# Patient Record
Sex: Female | Born: 1975 | Race: White | Hispanic: No | Marital: Married | Smoking: Never smoker
Health system: Southern US, Community
[De-identification: ages and names within clinical notes are randomized; demographics above are authoritative.]

## PROBLEM LIST (undated history)

## (undated) DIAGNOSIS — T7840XA Allergy, unspecified, initial encounter: Secondary | ICD-10-CM

## (undated) DIAGNOSIS — R32 Unspecified urinary incontinence: Secondary | ICD-10-CM

## (undated) DIAGNOSIS — R519 Headache, unspecified: Secondary | ICD-10-CM

## (undated) DIAGNOSIS — E079 Disorder of thyroid, unspecified: Secondary | ICD-10-CM

## (undated) DIAGNOSIS — R51 Headache: Secondary | ICD-10-CM

## (undated) HISTORY — DX: Unspecified urinary incontinence: R32

## (undated) HISTORY — DX: Disorder of thyroid, unspecified: E07.9

## (undated) HISTORY — DX: Allergy, unspecified, initial encounter: T78.40XA

## (undated) HISTORY — DX: Headache: R51

## (undated) HISTORY — DX: Headache, unspecified: R51.9

---

## 2010-04-20 ENCOUNTER — Ambulatory Visit: Payer: Self-pay | Admitting: Family Medicine

## 2010-04-20 DIAGNOSIS — J020 Streptococcal pharyngitis: Secondary | ICD-10-CM | POA: Insufficient documentation

## 2010-04-20 DIAGNOSIS — E039 Hypothyroidism, unspecified: Secondary | ICD-10-CM | POA: Insufficient documentation

## 2010-04-21 ENCOUNTER — Telehealth (INDEPENDENT_AMBULATORY_CARE_PROVIDER_SITE_OTHER): Payer: Self-pay | Admitting: *Deleted

## 2010-07-28 NOTE — Letter (Signed)
Summary: Out of Work  [facility]  [address]   [place], [place] [address]   Phone: [phone]  Fax: [phone]    April 20, 2010   Employee:  [employer]    To Whom It May Concern:   For Medical reasons, please excuse the above named employee from work today and tomorrow.   If you need additional information, please feel free to contact our office.         Sincerely,    Donna Christen MD

## 2010-07-28 NOTE — Progress Notes (Signed)
  Phone Note Outgoing Call Call back at [facility] Phone [phone]   Call placed by: Lajean Saver RN,  April 21, 2010 1:19 PM Call placed to: Patient Action Taken: Phone Call Completed Summary of Call: Callback: Patient states that her fever broke earlier today and the soreness in her throat is improved. She is taking her medications as prescribed.

## 2010-07-28 NOTE — Letter (Signed)
Summary: Handout Printed  Printed Handout:  - Rheumatic Fever 

## 2010-07-28 NOTE — Assessment & Plan Note (Signed)
Summary: HIGH FEVER/SEVERE SORE THROAT/NAUESA Room 5   Vital Signs:  Patient Profile:   35 Years Old Female CC:      Sore throat, fever, body aches, headache, diahrrea, nausea x 3 days Height:     65 inches Weight:      200 pounds O2 Sat:      99 % O2 treatment:    Room Air Temp:     102.3 degrees F oral Pulse rate:   124 / minute Pulse rhythm:   irregular Resp:     20 per minute BP sitting:   134 / 86  (left arm) Cuff size:   regular  Vitals Entered By: Emilio Math (April 20, 2010 8:38 AM)                  Current Allergies: No known allergies History of Present Illness Chief Complaint: Sore throat, fever, body aches, headache, diahrrea, nausea x 3 days History of Present Illness:  Subjective: Patient complains of sore throat for 3 days, worse on the right. No cough No pleuritic pain No wheezing No nasal congestion No post-nasal drainage No sinus pain/pressure No itchy/red eyes No earache No hemoptysis No SOB + fever/chills + nausea No vomiting No abdominal pain + diarrhea (several loose stools recently) No skin rashes + fatigue + myalgias + headache Used OTC meds without relief   REVIEW OF SYSTEMS Constitutional Symptoms       Complains of fever, chills, and night sweats.     Denies weight loss, weight gain, and fatigue.  Eyes       Denies change in vision, eye pain, eye discharge, glasses, contact lenses, and eye surgery. Ear/Nose/Throat/Mouth       Complains of ear pain and sore throat.      Denies hearing loss/aids, change in hearing, ear discharge, dizziness, frequent runny nose, frequent nose bleeds, sinus problems, hoarseness, and tooth pain or bleeding.  Respiratory       Denies dry cough, productive cough, wheezing, shortness of breath, asthma, bronchitis, and emphysema/COPD.  Cardiovascular       Denies murmurs, chest pain, and tires easily with exhertion.    Gastrointestinal       Complains of nausea/vomiting and diarrhea.      Denies  stomach pain, constipation, blood in bowel movements, and indigestion. Genitourniary       Denies painful urination, kidney stones, and loss of urinary control. Neurological       Complains of headaches.      Denies paralysis, seizures, and fainting/blackouts. Musculoskeletal       Denies muscle pain, joint pain, joint stiffness, decreased range of motion, redness, swelling, muscle weakness, and gout.  Skin       Denies bruising, unusual mles/lumps or sores, and hair/skin or nail changes.  Psych       Denies mood changes, temper/anger issues, anxiety/stress, speech problems, depression, and sleep problems.  Past History:  Past Medical History: Hypothyroidism  Past Surgical History: Denies surgical history  Family History: Mother, D  MVA Father, Healthy  Social History: non smoker ETOH yes  No Drugs Mrg [employer]   Objective:  No acute distress but appears uncomfortable Eyes:  Pupils are equal, round, and reactive to light and accomdation.  Extraocular movement is intact.  Conjunctivae are not inflamed.  Ears:  Canals normal.  Tympanic membranes normal.   Nose:  Normal septum.  Normal turbinates, mildly congested.    No sinus tenderness present.  Mouth:  moist mucous membranes  Neck:  Supple.  Tender enlarged anterior nodes are palpated bilaterally, worse on the right Lungs:  Clear to auscultation.  Breath sounds are equal.  Heart:  Regular rate and rhythm without murmurs, rubs, or gallops.  Abdomen:  Nontender without masses or hepatosplenomegaly.  Bowel sounds are present.  No CVA or flank tenderness.  Skin:  No rash Rapid strep test positive Assessment New Problems: PHARYNGITIS, STREPTOCOCCAL (ICD-034.0) HYPOTHYROIDISM (ICD-244.9)   Plan New Medications/Changes: LIDOCAINE VISCOUS 2 % SOLN (LIDOCAINE HCL) 15ml by mouth q3 to 4hr as needed.  Swish and spit out.  Max 8 doses/day  #200cc x 0, 04/20/2010, Donna Christen MD PREDNISONE 10 MG TABS (PREDNISONE) 2 PO BID for 2  days, then 1 BID for 2 days, then 1 daily for 2 days.  Take PC  #14 x 0, 04/20/2010, Donna Christen MD PENICILLIN V POTASSIUM 500 MG TABS (PENICILLIN V POTASSIUM) 1 by mouth q8hr for 10 days  #30 x 0, 04/20/2010, Donna Christen MD  New Orders: Rapid Strep [phone] New Patient Level III [99203] Planning Comments:   Begin oral penicillin for 10 days.  Tapering course of prednisone.  Lidocaine gargles.  Increase fluid intake. Follow-up with PCP if not improving 3 to 4 days. Return (or go to ER) for worsening symptoms (inability to swallow, etc)   The patient and/or caregiver has been counseled thoroughly with regard to medications prescribed including dosage, schedule, interactions, rationale for use, and possible side effects and they verbalize understanding.  Diagnoses and expected course of recovery discussed and will return if not improved as expected or if the condition worsens. Patient and/or caregiver verbalized understanding.  Prescriptions: LIDOCAINE VISCOUS 2 % SOLN (LIDOCAINE HCL) 15ml by mouth q3 to 4hr as needed.  Swish and spit out.  Max 8 doses/day  #200cc x 0   Entered and Authorized by:   Donna Christen MD   Signed by:   Donna Christen MD on 04/20/2010   Method used:   Print then Give to Patient   RxID:   [id number] PREDNISONE 10 MG TABS (PREDNISONE) 2 PO BID for 2 days, then 1 BID for 2 days, then 1 daily for 2 days.  Take PC  #14 x 0   Entered and Authorized by:   Donna Christen MD   Signed by:   Donna Christen MD on 04/20/2010   Method used:   Print then Give to Patient   RxID:   [id number] PENICILLIN V POTASSIUM 500 MG TABS (PENICILLIN V POTASSIUM) 1 by mouth q8hr for 10 days  #30 x 0   Entered and Authorized by:   Donna Christen MD   Signed by:   Donna Christen MD on 04/20/2010   Method used:   Print then Give to Patient   RxID:   [id number]   Orders Added: 1)  Rapid Strep [73220] 2)  New Patient Level III [25427]  Appended Document: HIGH FEVER/SEVERE  SORE THROAT/NAUESA Room 5 Rapid Strep: Pos

## 2015-07-30 ENCOUNTER — Telehealth: Payer: Self-pay | Admitting: Behavioral Health

## 2015-07-30 NOTE — Telephone Encounter (Signed)
Unable to reach patient at time of Pre-Visit Call.  Left message for patient to return call when available.    

## 2015-07-31 ENCOUNTER — Encounter: Payer: Self-pay | Admitting: Medical

## 2015-07-31 ENCOUNTER — Ambulatory Visit (INDEPENDENT_AMBULATORY_CARE_PROVIDER_SITE_OTHER): Payer: BLUE CROSS/BLUE SHIELD | Admitting: Medical

## 2015-07-31 VITALS — BP 120/80 | HR 81 | Temp 98.3°F | Ht 65.5 in | Wt 220.0 lb

## 2015-07-31 DIAGNOSIS — E039 Hypothyroidism, unspecified: Secondary | ICD-10-CM | POA: Diagnosis not present

## 2015-07-31 DIAGNOSIS — Z0001 Encounter for general adult medical examination with abnormal findings: Secondary | ICD-10-CM

## 2015-07-31 DIAGNOSIS — J309 Allergic rhinitis, unspecified: Secondary | ICD-10-CM

## 2015-07-31 DIAGNOSIS — Z0000 Encounter for general adult medical examination without abnormal findings: Secondary | ICD-10-CM

## 2015-07-31 DIAGNOSIS — N910 Primary amenorrhea: Secondary | ICD-10-CM

## 2015-07-31 DIAGNOSIS — N926 Irregular menstruation, unspecified: Secondary | ICD-10-CM

## 2015-07-31 DIAGNOSIS — M722 Plantar fascial fibromatosis: Secondary | ICD-10-CM

## 2015-07-31 LAB — POC URINALSYSI DIPSTICK (AUTOMATED)
BILIRUBIN UA: NEGATIVE
GLUCOSE UA: NEGATIVE
KETONES UA: NEGATIVE
Leukocytes, UA: NEGATIVE
Nitrite, UA: NEGATIVE
Protein, UA: NEGATIVE
RBC UA: NEGATIVE
UROBILINOGEN UA: 0.2
pH, UA: 5.5

## 2015-07-31 LAB — POCT URINE PREGNANCY: Preg Test, Ur: NEGATIVE

## 2015-07-31 NOTE — Patient Instructions (Addendum)
Wellness examination Will get cbc, cmp, tsh, lipid panel and ua. But labs will be future so you can get them done fasting.   Recommend diet and exercise for weight loss. Restarting thyroid med in near future may bring weight down.  Find exercises that won't put pressure on heel. Maybe try swimming or spinning exercises.   Recommend in spring if you get early allergy symptoms claritin otc and flonase otc..  Follow up 3 month or as needed.  Preventive Care for Adults, Female A healthy lifestyle and preventive care can promote health and wellness. Preventive health guidelines for women include the following key practices.  A routine yearly physical is a good way to check with your health care provider about your health and preventive screening. It is a chance to share any concerns and updates on your health and to receive a thorough exam.  Visit your dentist for a routine exam and preventive care every 6 months. Brush your teeth twice a day and floss once a day. Good oral hygiene prevents tooth decay and gum disease.  The frequency of eye exams is based on your age, health, family medical history, use of contact lenses, and other factors. Follow your health care provider's recommendations for frequency of eye exams.  Eat a healthy diet. Foods like vegetables, fruits, whole grains, low-fat dairy products, and lean protein foods contain the nutrients you need without too many calories. Decrease your intake of foods high in solid fats, added sugars, and salt. Eat the right amount of calories for you.Get information about a proper diet from your health care provider, if necessary.  Regular physical exercise is one of the most important things you can do for your health. Most adults should get at least 150 minutes of moderate-intensity exercise (any activity that increases your heart rate and causes you to sweat) each week. In addition, most adults need muscle-strengthening exercises on 2 or more  days a week.  Maintain a healthy weight. The body mass index (BMI) is a screening tool to identify possible weight problems. It provides an estimate of body fat based on height and weight. Your health care provider can find your BMI and can help you achieve or maintain a healthy weight.For adults 20 years and older:  A BMI below 18.5 is considered underweight.  A BMI of 18.5 to 24.9 is normal.  A BMI of 25 to 29.9 is considered overweight.  A BMI of 30 and above is considered obese.  Maintain normal blood lipids and cholesterol levels by exercising and minimizing your intake of saturated fat. Eat a balanced diet with plenty of fruit and vegetables. Blood tests for lipids and cholesterol should begin at age 66 and be repeated every 5 years. If your lipid or cholesterol levels are high, you are over 50, or you are at high risk for heart disease, you may need your cholesterol levels checked more frequently.Ongoing high lipid and cholesterol levels should be treated with medicines if diet and exercise are not working.  If you smoke, find out from your health care provider how to quit. If you do not use tobacco, do not start.  Lung cancer screening is recommended for adults aged 57-80 years who are at high risk for developing lung cancer because of a history of smoking. A yearly low-dose CT scan of the lungs is recommended for people who have at least a 30-pack-year history of smoking and are a current smoker or have quit within the past 15 years. A pack  year of smoking is smoking an average of 1 pack of cigarettes a day for 1 year (for example: 1 pack a day for 30 years or 2 packs a day for 15 years). Yearly screening should continue until the smoker has stopped smoking for at least 15 years. Yearly screening should be stopped for people who develop a health problem that would prevent them from having lung cancer treatment.  If you are pregnant, do not drink alcohol. If you are breastfeeding, be very  cautious about drinking alcohol. If you are not pregnant and choose to drink alcohol, do not have more than 1 drink per day. One drink is considered to be 12 ounces (355 mL) of beer, 5 ounces (148 mL) of wine, or 1.5 ounces (44 mL) of liquor.  Avoid use of street drugs. Do not share needles with anyone. Ask for help if you need support or instructions about stopping the use of drugs.  High blood pressure causes heart disease and increases the risk of stroke. Your blood pressure should be checked at least every 1 to 2 years. Ongoing high blood pressure should be treated with medicines if weight loss and exercise do not work.  If you are 30-16 years old, ask your health care provider if you should take aspirin to prevent strokes.  Diabetes screening is done by taking a blood sample to check your blood glucose level after you have not eaten for a certain period of time (fasting). If you are not overweight and you do not have risk factors for diabetes, you should be screened once every 3 years starting at age 66. If you are overweight or obese and you are 65-72 years of age, you should be screened for diabetes every year as part of your cardiovascular risk assessment.  Breast cancer screening is essential preventive care for women. You should practice "breast self-awareness." This means understanding the normal appearance and feel of your breasts and may include breast self-examination. Any changes detected, no matter how small, should be reported to a health care provider. Women in their 65s and 30s should have a clinical breast exam (CBE) by a health care provider as part of a regular health exam every 1 to 3 years. After age 26, women should have a CBE every year. Starting at age 31, women should consider having a mammogram (breast X-ray test) every year. Women who have a family history of breast cancer should talk to their health care provider about genetic screening. Women at a high risk of breast cancer  should talk to their health care providers about having an MRI and a mammogram every year.  Breast cancer gene (BRCA)-related cancer risk assessment is recommended for women who have family members with BRCA-related cancers. BRCA-related cancers include breast, ovarian, tubal, and peritoneal cancers. Having family members with these cancers may be associated with an increased risk for harmful changes (mutations) in the breast cancer genes BRCA1 and BRCA2. Results of the assessment will determine the need for genetic counseling and BRCA1 and BRCA2 testing.  Your health care provider may recommend that you be screened regularly for cancer of the pelvic organs (ovaries, uterus, and vagina). This screening involves a pelvic examination, including checking for microscopic changes to the surface of your cervix (Pap test). You may be encouraged to have this screening done every 3 years, beginning at age 50.  For women ages 15-65, health care providers may recommend pelvic exams and Pap testing every 3 years, or they may recommend the  Pap and pelvic exam, combined with testing for human papilloma virus (HPV), every 5 years. Some types of HPV increase your risk of cervical cancer. Testing for HPV may also be done on women of any age with unclear Pap test results.  Other health care providers may not recommend any screening for nonpregnant women who are considered low risk for pelvic cancer and who do not have symptoms. Ask your health care provider if a screening pelvic exam is right for you.  If you have had past treatment for cervical cancer or a condition that could lead to cancer, you need Pap tests and screening for cancer for at least 20 years after your treatment. If Pap tests have been discontinued, your risk factors (such as having a new sexual partner) need to be reassessed to determine if screening should resume. Some women have medical problems that increase the chance of getting cervical cancer. In  these cases, your health care provider may recommend more frequent screening and Pap tests.  Colorectal cancer can be detected and often prevented. Most routine colorectal cancer screening begins at the age of 54 years and continues through age 81 years. However, your health care provider may recommend screening at an earlier age if you have risk factors for colon cancer. On a yearly basis, your health care provider may provide home test kits to check for hidden blood in the stool. Use of a small camera at the end of a tube, to directly examine the colon (sigmoidoscopy or colonoscopy), can detect the earliest forms of colorectal cancer. Talk to your health care provider about this at age 70, when routine screening begins. Direct exam of the colon should be repeated every 5-10 years through age 50 years, unless early forms of precancerous polyps or small growths are found.  People who are at an increased risk for hepatitis B should be screened for this virus. You are considered at high risk for hepatitis B if:  You were born in a country where hepatitis B occurs often. Talk with your health care provider about which countries are considered high risk.  Your parents were born in a high-risk country and you have not received a shot to protect against hepatitis B (hepatitis B vaccine).  You have HIV or AIDS.  You use needles to inject street drugs.  You live with, or have sex with, someone who has hepatitis B.  You get hemodialysis treatment.  You take certain medicines for conditions like cancer, organ transplantation, and autoimmune conditions.  Hepatitis C blood testing is recommended for all people born from [birth year] through 1965 and any individual with known risks for hepatitis C.  Practice safe sex. Use condoms and avoid high-risk sexual practices to reduce the spread of sexually transmitted infections (STIs). STIs include gonorrhea, chlamydia, syphilis, trichomonas, herpes, HPV, and human  immunodeficiency virus (HIV). Herpes, HIV, and HPV are viral illnesses that have no cure. They can result in disability, cancer, and death.  You should be screened for sexually transmitted illnesses (STIs) including gonorrhea and chlamydia if:  You are sexually active and are younger than 24 years.  You are older than 24 years and your health care provider tells you that you are at risk for this type of infection.  Your sexual activity has changed since you were last screened and you are at an increased risk for chlamydia or gonorrhea. Ask your health care provider if you are at risk.  If you are at risk of being infected with HIV,  it is recommended that you take a prescription medicine daily to prevent HIV infection. This is called preexposure prophylaxis (PrEP). You are considered at risk if:  You are sexually active and do not regularly use condoms or know the HIV status of your partner(s).  You take drugs by injection.  You are sexually active with a partner who has HIV.  Talk with your health care provider about whether you are at high risk of being infected with HIV. If you choose to begin PrEP, you should first be tested for HIV. You should then be tested every 3 months for as long as you are taking PrEP.  Osteoporosis is a disease in which the bones lose minerals and strength with aging. This can result in serious bone fractures or breaks. The risk of osteoporosis can be identified using a bone density scan. Women ages 63 years and over and women at risk for fractures or osteoporosis should discuss screening with their health care providers. Ask your health care provider whether you should take a calcium supplement or vitamin D to reduce the rate of osteoporosis.  Menopause can be associated with physical symptoms and risks. Hormone replacement therapy is available to decrease symptoms and risks. You should talk to your health care provider about whether hormone replacement therapy is  right for you.  Use sunscreen. Apply sunscreen liberally and repeatedly throughout the day. You should seek shade when your shadow is shorter than you. Protect yourself by wearing long sleeves, pants, a wide-brimmed hat, and sunglasses year round, whenever you are outdoors.  Once a month, do a whole body skin exam, using a mirror to look at the skin on your back. Tell your health care provider of new moles, moles that have irregular borders, moles that are larger than a pencil eraser, or moles that have changed in shape or color.  Stay current with required vaccines (immunizations).  Influenza vaccine. All adults should be immunized every year.  Tetanus, diphtheria, and acellular pertussis (Td, Tdap) vaccine. Pregnant women should receive 1 dose of Tdap vaccine during each pregnancy. The dose should be obtained regardless of the length of time since the last dose. Immunization is preferred during the 27th-36th week of gestation. An adult who has not previously received Tdap or who does not know her vaccine status should receive 1 dose of Tdap. This initial dose should be followed by tetanus and diphtheria toxoids (Td) booster doses every 10 years. Adults with an unknown or incomplete history of completing a 3-dose immunization series with Td-containing vaccines should begin or complete a primary immunization series including a Tdap dose. Adults should receive a Td booster every 10 years.  Varicella vaccine. An adult without evidence of immunity to varicella should receive 2 doses or a second dose if she has previously received 1 dose. Pregnant females who do not have evidence of immunity should receive the first dose after pregnancy. This first dose should be obtained before leaving the health care facility. The second dose should be obtained 4-8 weeks after the first dose.  Human papillomavirus (HPV) vaccine. Females aged 13-26 years who have not received the vaccine previously should obtain the  3-dose series. The vaccine is not recommended for use in pregnant females. However, pregnancy testing is not needed before receiving a dose. If a female is found to be pregnant after receiving a dose, no treatment is needed. In that case, the remaining doses should be delayed until after the pregnancy. Immunization is recommended for any person with  an immunocompromised condition through the age of 69 years if she did not get any or all doses earlier. During the 3-dose series, the second dose should be obtained 4-8 weeks after the first dose. The third dose should be obtained 24 weeks after the first dose and 16 weeks after the second dose.  Zoster vaccine. One dose is recommended for adults aged 9 years or older unless certain conditions are present.  Measles, mumps, and rubella (MMR) vaccine. Adults born before [birth year] generally are considered immune to measles and mumps. Adults born in [birth year] or later should have 1 or more doses of MMR vaccine unless there is a contraindication to the vaccine or there is laboratory evidence of immunity to each of the three diseases. A routine second dose of MMR vaccine should be obtained at least 28 days after the first dose for students attending postsecondary schools, health care workers, or international travelers. People who received inactivated measles vaccine or an unknown type of measles vaccine during 1963-1967 should receive 2 doses of MMR vaccine. People who received inactivated mumps vaccine or an unknown type of mumps vaccine before 1979 and are at high risk for mumps infection should consider immunization with 2 doses of MMR vaccine. For females of childbearing age, rubella immunity should be determined. If there is no evidence of immunity, females who are not pregnant should be vaccinated. If there is no evidence of immunity, females who are pregnant should delay immunization until after pregnancy. Unvaccinated health care workers born before [birth year] who lack  laboratory evidence of measles, mumps, or rubella immunity or laboratory confirmation of disease should consider measles and mumps immunization with 2 doses of MMR vaccine or rubella immunization with 1 dose of MMR vaccine.  Pneumococcal 13-valent conjugate (PCV13) vaccine. When indicated, a person who is uncertain of his immunization history and has no record of immunization should receive the PCV13 vaccine. All adults 23 years of age and older should receive this vaccine. An adult aged 3 years or older who has certain medical conditions and has not been previously immunized should receive 1 dose of PCV13 vaccine. This PCV13 should be followed with a dose of pneumococcal polysaccharide (PPSV23) vaccine. Adults who are at high risk for pneumococcal disease should obtain the PPSV23 vaccine at least 8 weeks after the dose of PCV13 vaccine. Adults older than 40 years of age who have normal immune system function should obtain the PPSV23 vaccine dose at least 1 year after the dose of PCV13 vaccine.  Pneumococcal polysaccharide (PPSV23) vaccine. When PCV13 is also indicated, PCV13 should be obtained first. All adults aged 76 years and older should be immunized. An adult younger than age 39 years who has certain medical conditions should be immunized. Any person who resides in a nursing home or long-term care facility should be immunized. An adult smoker should be immunized. People with an immunocompromised condition and certain other conditions should receive both PCV13 and PPSV23 vaccines. People with human immunodeficiency virus (HIV) infection should be immunized as soon as possible after diagnosis. Immunization during chemotherapy or radiation therapy should be avoided. Routine use of PPSV23 vaccine is not recommended for American Indians, [place] Natives, or people younger than 65 years unless there are medical conditions that require PPSV23 vaccine. When indicated, people who have unknown immunization and have  no record of immunization should receive PPSV23 vaccine. One-time revaccination 5 years after the first dose of PPSV23 is recommended for people aged 19-64 years who have chronic kidney failure, nephrotic syndrome,  asplenia, or immunocompromised conditions. People who received 1-2 doses of PPSV23 before age 1 years should receive another dose of PPSV23 vaccine at age 4 years or later if at least 5 years have passed since the previous dose. Doses of PPSV23 are not needed for people immunized with PPSV23 at or after age 72 years.  Meningococcal vaccine. Adults with asplenia or persistent complement component deficiencies should receive 2 doses of quadrivalent meningococcal conjugate (MenACWY-D) vaccine. The doses should be obtained at least 2 months apart. Microbiologists working with certain meningococcal bacteria, [employer] recruits, people at risk during an outbreak, and people who travel to or live in countries with a high rate of meningitis should be immunized. A first-year college student up through age 64 years who is living in a residence hall should receive a dose if she did not receive a dose on or after her 16th birthday. Adults who have certain high-risk conditions should receive one or more doses of vaccine.  Hepatitis A vaccine. Adults who wish to be protected from this disease, have certain high-risk conditions, work with hepatitis A-infected animals, work in hepatitis A research labs, or travel to or work in countries with a high rate of hepatitis A should be immunized. Adults who were previously unvaccinated and who anticipate close contact with an international adoptee during the first 60 days after arrival in the [place] from a country with a high rate of hepatitis A should be immunized.  Hepatitis B vaccine. Adults who wish to be protected from this disease, have certain high-risk conditions, may be exposed to blood or other infectious body fluids, are household contacts or sex  partners of hepatitis B positive people, are clients or workers in certain care facilities, or travel to or work in countries with a high rate of hepatitis B should be immunized.  Haemophilus influenzae type b (Hib) vaccine. A previously unvaccinated person with asplenia or sickle cell disease or having a scheduled splenectomy should receive 1 dose of Hib vaccine. Regardless of previous immunization, a recipient of a hematopoietic stem cell transplant should receive a 3-dose series 6-12 months after her successful transplant. Hib vaccine is not recommended for adults with HIV infection. Preventive Services / Frequency Ages 16 to 60 years  Blood pressure check.** / Every 3-5 years.  Lipid and cholesterol check.** / Every 5 years beginning at age 81.  Clinical breast exam.** / Every 3 years for women in their 67s and 75s.  BRCA-related cancer risk assessment.** / For women who have family members with a BRCA-related cancer (breast, ovarian, tubal, or peritoneal cancers).  Pap test.** / Every 2 years from ages 31 through 5. Every 3 years starting at age 20 through age 57 or 42 with a history of 3 consecutive normal Pap tests.  HPV screening.** / Every 3 years from ages 69 through ages 51 to 77 with a history of 3 consecutive normal Pap tests.  Hepatitis C blood test.** / For any individual with known risks for hepatitis C.  Skin self-exam. / Monthly.  Influenza vaccine. / Every year.  Tetanus, diphtheria, and acellular pertussis (Tdap, Td) vaccine.** / Consult your health care provider. Pregnant women should receive 1 dose of Tdap vaccine during each pregnancy. 1 dose of Td every 10 years.  Varicella vaccine.** / Consult your health care provider. Pregnant females who do not have evidence of immunity should receive the first dose after pregnancy.  HPV vaccine. / 3 doses over 6 months, if 48 and younger. The vaccine is  not recommended for use in pregnant females. However, pregnancy testing  is not needed before receiving a dose.  Measles, mumps, rubella (MMR) vaccine.** / You need at least 1 dose of MMR if you were born in 1957 or later. You may also need a 2nd dose. For females of childbearing age, rubella immunity should be determined. If there is no evidence of immunity, females who are not pregnant should be vaccinated. If there is no evidence of immunity, females who are pregnant should delay immunization until after pregnancy.  Pneumococcal 13-valent conjugate (PCV13) vaccine.** / Consult your health care provider.  Pneumococcal polysaccharide (PPSV23) vaccine.** / 1 to 2 doses if you smoke cigarettes or if you have certain conditions.  Meningococcal vaccine.** / 1 dose if you are age 2 to 69 years and a [employer] researcher living in a residence hall, or have one of several medical conditions, you need to get vaccinated against meningococcal disease. You may also need additional booster doses.  Hepatitis A vaccine.** / Consult your health care provider.  Hepatitis B vaccine.** / Consult your health care provider.  Haemophilus influenzae type b (Hib) vaccine.** / Consult your health care provider. Ages 34 to 67 years  Blood pressure check.** / Every year.  Lipid and cholesterol check.** / Every 5 years beginning at age 71 years.  Lung cancer screening. / Every year if you are aged 41-80 years and have a 30-pack-year history of smoking and currently smoke or have quit within the past 15 years. Yearly screening is stopped once you have quit smoking for at least 15 years or develop a health problem that would prevent you from having lung cancer treatment.  Clinical breast exam.** / Every year after age 35 years.  BRCA-related cancer risk assessment.** / For women who have family members with a BRCA-related cancer (breast, ovarian, tubal, or peritoneal cancers).  Mammogram.** / Every year beginning at age 75 years and continuing for as long as you are in good  health. Consult with your health care provider.  Pap test.** / Every 3 years starting at age 55 years through age 21 or 69 years with a history of 3 consecutive normal Pap tests.  HPV screening.** / Every 3 years from ages 57 years through ages 71 to 49 years with a history of 3 consecutive normal Pap tests.  Fecal occult blood test (FOBT) of stool. / Every year beginning at age 33 years and continuing until age 62 years. You may not need to do this test if you get a colonoscopy every 10 years.  Flexible sigmoidoscopy or colonoscopy.** / Every 5 years for a flexible sigmoidoscopy or every 10 years for a colonoscopy beginning at age 29 years and continuing until age 67 years.  Hepatitis C blood test.** / For all people born from [birth year] through 1965 and any individual with known risks for hepatitis C.  Skin self-exam. / Monthly.  Influenza vaccine. / Every year.  Tetanus, diphtheria, and acellular pertussis (Tdap/Td) vaccine.** / Consult your health care provider. Pregnant women should receive 1 dose of Tdap vaccine during each pregnancy. 1 dose of Td every 10 years.  Varicella vaccine.** / Consult your health care provider. Pregnant females who do not have evidence of immunity should receive the first dose after pregnancy.  Zoster vaccine.** / 1 dose for adults aged 65 years or older.  Measles, mumps, rubella (MMR) vaccine.** / You need at least 1 dose of MMR if you were born in 1957 or later. You may also  need a second dose. For females of childbearing age, rubella immunity should be determined. If there is no evidence of immunity, females who are not pregnant should be vaccinated. If there is no evidence of immunity, females who are pregnant should delay immunization until after pregnancy.  Pneumococcal 13-valent conjugate (PCV13) vaccine.** / Consult your health care provider.  Pneumococcal polysaccharide (PPSV23) vaccine.** / 1 to 2 doses if you smoke cigarettes or if you have certain  conditions.  Meningococcal vaccine.** / Consult your health care provider.  Hepatitis A vaccine.** / Consult your health care provider.  Hepatitis B vaccine.** / Consult your health care provider.  Haemophilus influenzae type b (Hib) vaccine.** / Consult your health care provider. Ages 105 years and over  Blood pressure check.** / Every year.  Lipid and cholesterol check.** / Every 5 years beginning at age 70 years.  Lung cancer screening. / Every year if you are aged 39-80 years and have a 30-pack-year history of smoking and currently smoke or have quit within the past 15 years. Yearly screening is stopped once you have quit smoking for at least 15 years or develop a health problem that would prevent you from having lung cancer treatment.  Clinical breast exam.** / Every year after age 88 years.  BRCA-related cancer risk assessment.** / For women who have family members with a BRCA-related cancer (breast, ovarian, tubal, or peritoneal cancers).  Mammogram.** / Every year beginning at age 9 years and continuing for as long as you are in good health. Consult with your health care provider.  Pap test.** / Every 3 years starting at age 66 years through age 51 or 67 years with 3 consecutive normal Pap tests. Testing can be stopped between 65 and 70 years with 3 consecutive normal Pap tests and no abnormal Pap or HPV tests in the past 10 years.  HPV screening.** / Every 3 years from ages 21 years through ages 34 or 37 years with a history of 3 consecutive normal Pap tests. Testing can be stopped between 65 and 70 years with 3 consecutive normal Pap tests and no abnormal Pap or HPV tests in the past 10 years.  Fecal occult blood test (FOBT) of stool. / Every year beginning at age 76 years and continuing until age 55 years. You may not need to do this test if you get a colonoscopy every 10 years.  Flexible sigmoidoscopy or colonoscopy.** / Every 5 years for a flexible sigmoidoscopy or every 10  years for a colonoscopy beginning at age 55 years and continuing until age 46 years.  Hepatitis C blood test.** / For all people born from [birth year] through 1965 and any individual with known risks for hepatitis C.  Osteoporosis screening.** / A one-time screening for women ages 36 years and over and women at risk for fractures or osteoporosis.  Skin self-exam. / Monthly.  Influenza vaccine. / Every year.  Tetanus, diphtheria, and acellular pertussis (Tdap/Td) vaccine.** / 1 dose of Td every 10 years.  Varicella vaccine.** / Consult your health care provider.  Zoster vaccine.** / 1 dose for adults aged 18 years or older.  Pneumococcal 13-valent conjugate (PCV13) vaccine.** / Consult your health care provider.  Pneumococcal polysaccharide (PPSV23) vaccine.** / 1 dose for all adults aged 49 years and older.  Meningococcal vaccine.** / Consult your health care provider.  Hepatitis A vaccine.** / Consult your health care provider.  Hepatitis B vaccine.** / Consult your health care provider.  Haemophilus influenzae type b (Hib) vaccine.** / Consult  your health care provider. ** Family history and personal history of risk and conditions may change your health care provider's recommendations.   This information is not intended to replace advice given to you by your health care provider. Make sure you discuss any questions you have with your health care provider.   Document Released: 08/10/2001 Document Revised: 07/05/2014 Document Reviewed: 11/09/2010 Elsevier Interactive Patient Education [employer].

## 2015-07-31 NOTE — Progress Notes (Signed)
Pre visit review using our clinic review tool, if applicable. No additional management support is needed unless otherwise documented below in the visit note. 

## 2015-07-31 NOTE — Progress Notes (Signed)
Subjective:    Patient ID: Candace Taylor, female    DOB: [birth year], 40 y.o.   MRN: [id number]  HPI  I have reviewed pt PMH, PSH, FH, Social History and Surgical History.  No recent primary care/pcp.  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 She stopped taking tabs about 5 years ago. She states just never go tablets refilled. Pt does states tired all the time. And she weighs most she ever weighted before.   Allergic rhinitis- spring and fall are her worst symptoms.  Pt has plantar fascitis. Pt had this pain since June. Pt has known heal spur.  Pt last pap smear was 03/24/23 of last year.   Mom- deceased motor accident 66 yo.  LMP- June 20, 2016. Pt took $4 preg test. That test was negative 1 week ago. But menses still not occurred.     Review of Systems  Constitutional: Negative for fever, chills and fatigue.  HENT: Negative for congestion, dental problem, nosebleeds, postnasal drip, rhinorrhea and sinus pressure.   Respiratory: Negative for choking, chest tightness, shortness of breath and wheezing.   Cardiovascular: Negative for chest pain and palpitations.  Gastrointestinal: Negative for abdominal pain.  Musculoskeletal:       Plantar heel pain.  Neurological: Negative for dizziness and light-headedness.  Hematological: Negative for adenopathy. Does not bruise/bleed easily.  Psychiatric/Behavioral: Negative for behavioral problems, confusion and dysphoric mood. The patient is not nervous/anxious.        Describes some sadness about her weight and general health.     Past Medical History  Diagnosis Date  . Frequent headaches   . Thyroid disease   . Urine incontinence   . Allergy     Social History   Social History  . Marital Status: Married    Spouse Name: N/A  .  Number of Children: N/A  . Years of Education: N/A   Occupational History  . Not on file.   Social History Main Topics  . Smoking status: Never Smoker   . Smokeless tobacco: Never Used  . Alcohol Use: No     Comment: occasionally  . Drug Use: No  . Sexual Activity: Yes   Other Topics Concern  . Not on file   Social History Narrative  . No narrative on file    History reviewed. No pertinent past surgical history.  History reviewed. No pertinent family history.  No Known Allergies  No current outpatient prescriptions on file prior to visit.   No current facility-administered medications on file prior to visit.    BP 120/80 mmHg  Pulse 81  Temp(Src) 98.3 F (36.8 C) (Oral)  Ht 5' 5.5" (1.664 m)  Wt 220 lb (99.791 kg)  BMI 36.04 kg/m2  SpO2 98%  LMP 06/21/2015       Objective:   Physical Exam  General Mental Status- Alert. General Appearance- Not in acute distress.   Skin General: Color- Normal Color. Moisture- Normal Moisture. Rt cheek. 6 mm dark mole. History or this and no recent changes.  Neck Carotid Arteries- Normal color. Moisture- Normal Moisture. No carotid bruits. No thyromegaly.  Chest and Lung Exam Auscultation: Breath Sounds:-Normal.  Cardiovascular Auscultation:Rythm- Regular. Murmurs & Other Heart Sounds:Auscultation of the heart reveals- No Murmurs.  Abdomen Inspection:-Inspeection Normal. Palpation/Percussion:Note:No mass. Palpation and Percussion of the abdomen reveal-  Non Tender, Non Distended + BS, no rebound or guarding.  Neurologic Cranial Nerve exam:- CN III-XII intact(No nystagmus), symmetric smile. Strength:- 5/5 equal and symmetric strength both upper and lower extremities.  Back- no cva tenderness      Assessment & Plan:  preg test negative today.

## 2015-07-31 NOTE — Assessment & Plan Note (Addendum)
Will get cbc, cmp, tsh, lipid panel and ua. But labs will be future so you can get them done fasting.

## 2015-07-31 NOTE — Assessment & Plan Note (Signed)
Will wait on tsh results and decide on dose to restart.

## 2015-08-04 ENCOUNTER — Telehealth: Payer: Self-pay | Admitting: Medical

## 2015-08-04 ENCOUNTER — Other Ambulatory Visit (INDEPENDENT_AMBULATORY_CARE_PROVIDER_SITE_OTHER): Payer: BLUE CROSS/BLUE SHIELD

## 2015-08-04 DIAGNOSIS — Z0189 Encounter for other specified special examinations: Secondary | ICD-10-CM

## 2015-08-04 DIAGNOSIS — Z0000 Encounter for general adult medical examination without abnormal findings: Secondary | ICD-10-CM

## 2015-08-04 LAB — COMPREHENSIVE METABOLIC PANEL
ALBUMIN: 4 g/dL (ref 3.5–5.2)
ALK PHOS: 48 U/L (ref 39–117)
ALT: 23 U/L (ref 0–35)
AST: 16 U/L (ref 0–37)
BILIRUBIN TOTAL: 0.8 mg/dL (ref 0.2–1.2)
BUN: 19 mg/dL (ref 6–23)
CALCIUM: 9 mg/dL (ref 8.4–10.5)
CHLORIDE: 102 meq/L (ref 96–112)
CO2: 29 mEq/L (ref 19–32)
CREATININE: 0.88 mg/dL (ref 0.40–1.20)
GFR: 75.71 mL/min (ref >60.00)
Glucose, Bld: 82 mg/dL (ref 70–99)
Potassium: 4 mEq/L (ref 3.5–5.1)
SODIUM: 137 meq/L (ref 135–145)
TOTAL PROTEIN: 7.4 g/dL (ref 6.0–8.3)

## 2015-08-04 LAB — LIPID PANEL
CHOLESTEROL: 191 mg/dL (ref 0–200)
HDL: 49.3 mg/dL (ref >39.00)
LDL Cholesterol: 129 mg/dL — ABNORMAL HIGH (ref 0–99)
NONHDL: 142.07
Total CHOL/HDL Ratio: 4
Triglycerides: 64 mg/dL (ref 0.0–149.0)
VLDL: 12.8 mg/dL (ref 0.0–40.0)

## 2015-08-04 LAB — CBC WITH DIFFERENTIAL/PLATELET
BASOS ABS: 0 10*3/uL (ref 0.0–0.1)
Basophils Relative: 0.4 % (ref 0.0–3.0)
Eosinophils Absolute: 0.1 10*3/uL (ref 0.0–0.7)
Eosinophils Relative: 1.6 % (ref 0.0–5.0)
HCT: 41.8 % (ref 36.0–46.0)
Hemoglobin: 13.8 g/dL (ref 12.0–15.0)
LYMPHS ABS: 1.7 10*3/uL (ref 0.7–4.0)
Lymphocytes Relative: 21.4 % (ref 12.0–46.0)
MCHC: 33 g/dL (ref 30.0–36.0)
MCV: 85.2 fl (ref 78.0–100.0)
MONO ABS: 0.6 10*3/uL (ref 0.1–1.0)
MONOS PCT: 7.4 % (ref 3.0–12.0)
NEUTROS ABS: 5.5 10*3/uL (ref 1.4–7.7)
NEUTROS PCT: 69.2 % (ref 43.0–77.0)
PLATELETS: 255 10*3/uL (ref 150.0–400.0)
RBC: 4.91 Mil/uL (ref 3.87–5.11)
RDW: 14.6 % (ref 11.5–15.5)
WBC: 7.9 10*3/uL (ref 4.0–10.5)

## 2015-08-04 LAB — TSH: TSH: 9.92 u[IU]/mL — AB (ref 0.35–4.50)

## 2015-08-04 MED ORDER — LEVOTHYROXINE SODIUM 100 MCG PO TABS: 100.0000 ug | ORAL_TABLET | Freq: Every day | ORAL | Status: DC

## 2015-08-04 NOTE — Telephone Encounter (Signed)
Sent in her levothryoxine

## 2015-11-23 ENCOUNTER — Other Ambulatory Visit: Payer: Self-pay | Admitting: Medical

## 2020-11-25 ENCOUNTER — Ambulatory Visit
Admission: RE | Admit: 2020-11-25 | Discharge: 2020-11-25 | Disposition: A | Discharge status: Home / Self Care | Payer: PRIVATE HEALTH INSURANCE | Source: Ambulatory Visit | Attending: Sports Medicine | Admitting: Sports Medicine

## 2020-11-25 ENCOUNTER — Other Ambulatory Visit: Payer: Self-pay | Admitting: Sports Medicine

## 2020-11-25 DIAGNOSIS — M25572 Pain in left ankle and joints of left foot: Secondary | ICD-10-CM

## 2020-11-25 DIAGNOSIS — M25571 Pain in right ankle and joints of right foot: Secondary | ICD-10-CM

## 2020-11-25 DIAGNOSIS — M25472 Effusion, left ankle: Secondary | ICD-10-CM

## 2020-12-25 ENCOUNTER — Other Ambulatory Visit: Payer: Self-pay | Admitting: Sports Medicine

## 2020-12-25 DIAGNOSIS — M25572 Pain in left ankle and joints of left foot: Secondary | ICD-10-CM

## 2021-01-03 ENCOUNTER — Other Ambulatory Visit: Payer: Self-pay

## 2021-01-03 ENCOUNTER — Ambulatory Visit
Admission: RE | Admit: 2021-01-03 | Discharge: 2021-01-03 | Disposition: A | Discharge status: Home / Self Care | Payer: PRIVATE HEALTH INSURANCE | Source: Ambulatory Visit | Attending: Sports Medicine | Admitting: Sports Medicine

## 2021-01-03 DIAGNOSIS — M25572 Pain in left ankle and joints of left foot: Secondary | ICD-10-CM

## 2021-01-15 ENCOUNTER — Ambulatory Visit (INDEPENDENT_AMBULATORY_CARE_PROVIDER_SITE_OTHER): Payer: PRIVATE HEALTH INSURANCE | Admitting: Orthopedic Surgery

## 2021-01-15 ENCOUNTER — Other Ambulatory Visit: Payer: Self-pay

## 2021-01-15 DIAGNOSIS — M6702 Short Achilles tendon (acquired), left ankle: Secondary | ICD-10-CM

## 2021-01-25 ENCOUNTER — Encounter: Payer: Self-pay | Admitting: Orthopedic Surgery

## 2021-01-25 NOTE — Progress Notes (Signed)
Office Visit Note   Patient: Candace Taylor           Date of Birth: Nov 02, 1975           MRN: [id number] Visit Date: 01/15/2021              Requested by: Esperanza Richters, PA-C [address] [place],  [place] [address] PCP: Esperanza Richters, PA-C  Chief Complaint  Patient presents with   Left Foot - Pain      HPI: Patient is a 45 year old woman who is seen for initial evaluation and referral from Dr. Berline Chough.  Patient has had an MRI scan which shows a torn anterior talofibular ligament.  Patient complains of left foot pain she states she works on her feet has no history of injury.  She complains of pain over the Achilles midfoot and radiates anteriorly.  Patient denies any recent ankle sprains.  Assessment & Plan: Visit Diagnoses:  1. Achilles tendon contracture, left     Plan: Patient was given instructions and demonstrated Achilles stretching recommended a stiff soled sneaker and Voltaren gel.  Follow-Up Instructions: Return in about 4 weeks (around 02/12/2021).   Ortho Exam  Patient is alert, oriented, no adenopathy, well-dressed, normal affect, normal respiratory effort. Examination patient has no pain to palpation of the anterior talofibular ligament anterior drawer stable she has significant Achilles contracture with dorsiflexion of the ankle 20 degrees short of neutral with her knee extended.  She is tender to palpation along the Achilles tendon no palpable defects.  Plantar fascia is nontender to palpation.  Imaging: No results found. No images are attached to the encounter.  Labs: No results found for: HGBA1C, ESRSEDRATE, CRP, LABURIC, REPTSTATUS, GRAMSTAIN, CULT, LABORGA   Lab Results  Component Value Date   ALBUMIN 4.0 08/04/2015    No results found for: MG No results found for: VD25OH  No results found for: PREALBUMIN CBC EXTENDED Latest Ref Rng & Units 08/04/2015  WBC 4.0 - 10.5 K/uL 7.9  RBC 3.87 - 5.11 Mil/uL 4.91  HGB 12.0 - 15.0 g/dL 45.3   HCT 64.6 - 80.3 % 41.8  PLT 150.0 - 400.0 K/uL 255.0  NEUTROABS 1.4 - 7.7 K/uL 5.5  LYMPHSABS 0.7 - 4.0 K/uL 1.7     There is no height or weight on file to calculate BMI.  Orders:  No orders of the defined types were placed in this encounter.  No orders of the defined types were placed in this encounter.    Procedures: No procedures performed  Clinical Data: No additional findings.  ROS:  All other systems negative, except as noted in the HPI. Review of Systems  Objective: Vital Signs: There were no vitals taken for this visit.  Specialty Comments:  No specialty comments available.  PMFS History: Patient Active Problem List   Diagnosis Date Noted   Wellness examination 07/31/2015   Plantar fasciitis 07/31/2015   Allergic rhinitis 07/31/2015   PHARYNGITIS, STREPTOCOCCAL 04/20/2010   Hypothyroidism 04/20/2010   Past Medical History:  Diagnosis Date   Allergy    Frequent headaches    Thyroid disease    Urine incontinence     History reviewed. No pertinent family history.  History reviewed. No pertinent surgical history. Social History   Occupational History   Not on file  Tobacco Use   Smoking status: Never   Smokeless tobacco: Never  Substance and Sexual Activity   Alcohol use: No    Alcohol/week: 0.0 standard drinks  Comment: occasionally   Drug use: No   Sexual activity: Yes

## 2021-02-13 ENCOUNTER — Other Ambulatory Visit: Payer: Self-pay

## 2021-02-13 ENCOUNTER — Encounter: Payer: Self-pay | Admitting: Physician Assistant

## 2021-02-13 ENCOUNTER — Ambulatory Visit (INDEPENDENT_AMBULATORY_CARE_PROVIDER_SITE_OTHER): Payer: PRIVATE HEALTH INSURANCE | Admitting: Physician Assistant

## 2021-02-13 VITALS — Ht 65.0 in | Wt 230.0 lb

## 2021-02-13 DIAGNOSIS — M6702 Short Achilles tendon (acquired), left ankle: Secondary | ICD-10-CM | POA: Diagnosis not present

## 2021-02-13 NOTE — Progress Notes (Signed)
Office Visit Note   Patient: Candace Taylor           Date of Birth: November 30, 1975           MRN: [id number] Visit Date: 02/13/2021              Requested by: Esperanza Richters, PA-C [address] [place],  [place] [address] PCP: Esperanza Richters, PA-C  Chief Complaint  Patient presents with   Left Foot - Follow-up      HPI: Patient is a 45 year old woman who is here for 4-week follow-up for her left ankle and Achilles contracture.  She has been seeing Dr. Berline Chough for this for 3 to 4 months.  She has had an MRI.  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  The rest of the MRI was unremarkable.  She has had intra-articular injections which she says did not help even from a diagnostic standpoint.  She is try different type of supplements and has had Celebrex.  She was last seen by Dr. Lajoyce Corners who diagnosed her with an Achilles tractor and thought this was placing undue stress on the rest of her ankle.  He did have her do stretching exercises on her own but she says she is no better.  She says she has pain over the anterior aspect laterally of her ankle over the ligaments as well as a pulling sensation over the anterior ankle medial ankle really does not bother her much.  She denies any injury  Assessment & Plan: Visit Diagnoses:  1. Achilles tendon contracture, left     Plan: The only thing she has not this point is formal physical therapy.  She is wearing good shoe wear.  I will refer her to physical therapy for Achilles stretching as well as ankle stabilization and modalities.  She will follow-up with Dr. Lajoyce Corners when she has completed 4 weeks of physical therapy  Follow-Up Instructions: No follow-ups on file.   Ortho Exam  Patient is alert, oriented, no adenopathy, well-dressed, normal affect, normal respiratory effort. Examination of her right ankle she has no swelling no redness no signs of infection.  She is focally tender over the ATFL.  Still some tenderness over the  anterior ankle.  She does continue to have an Achilles contracture though I think it is improved from her previous visit.  She has a good endpoint on anterior draw  Imaging: No results found. No images are attached to the encounter.  Labs: No results found for: HGBA1C, ESRSEDRATE, CRP, LABURIC, REPTSTATUS, GRAMSTAIN, CULT, LABORGA   Lab Results  Component Value Date   ALBUMIN 4.0 08/04/2015    No results found for: MG No results found for: VD25OH  No results found for: PREALBUMIN CBC EXTENDED Latest Ref Rng & Units 08/04/2015  WBC 4.0 - 10.5 K/uL 7.9  RBC 3.87 - 5.11 Mil/uL 4.91  HGB 12.0 - 15.0 g/dL 01.0  HCT 93.2 - 35.5 % 41.8  PLT 150.0 - 400.0 K/uL 255.0  NEUTROABS 1.4 - 7.7 K/uL 5.5  LYMPHSABS 0.7 - 4.0 K/uL 1.7     Body mass index is 38.27 kg/m.  Orders:  Orders Placed This Encounter  Procedures   Ambulatory referral to Physical Therapy   No orders of the defined types were placed in this encounter.    Procedures: No procedures performed  Clinical Data: No additional findings.  ROS:  All other systems negative, except as noted in the HPI. Review of Systems  Objective: Vital Signs: Ht 5\' 5"  (1.651 m)   Wt 230 lb (104.3 kg)   BMI 38.27 kg/m   Specialty Comments:  No specialty comments available.  PMFS History: Patient Active Problem List   Diagnosis Date Noted   Wellness examination 07/31/2015   Plantar fasciitis 07/31/2015   Allergic rhinitis 07/31/2015   PHARYNGITIS, STREPTOCOCCAL 04/20/2010   Hypothyroidism 04/20/2010   Past Medical History:  Diagnosis Date   Allergy    Frequent headaches    Thyroid disease    Urine incontinence     No family history on file.  No past surgical history on file. Social History   Occupational History   Not on file  Tobacco Use   Smoking status: Never   Smokeless tobacco: Never  Substance and Sexual Activity   Alcohol use: No    Alcohol/week: 0.0 standard drinks    Comment: occasionally   Drug  use: No   Sexual activity: Yes

## 2021-02-23 ENCOUNTER — Other Ambulatory Visit: Payer: Self-pay

## 2021-02-23 ENCOUNTER — Encounter: Payer: Self-pay | Admitting: Physical Therapy

## 2021-02-23 ENCOUNTER — Ambulatory Visit: Payer: PRIVATE HEALTH INSURANCE | Admitting: Physical Therapy

## 2021-02-23 DIAGNOSIS — M6281 Muscle weakness (generalized): Secondary | ICD-10-CM | POA: Diagnosis not present

## 2021-02-23 DIAGNOSIS — R262 Difficulty in walking, not elsewhere classified: Secondary | ICD-10-CM | POA: Diagnosis not present

## 2021-02-23 DIAGNOSIS — M25572 Pain in left ankle and joints of left foot: Secondary | ICD-10-CM | POA: Diagnosis not present

## 2021-02-23 DIAGNOSIS — R600 Localized edema: Secondary | ICD-10-CM

## 2021-02-23 DIAGNOSIS — M25672 Stiffness of left ankle, not elsewhere classified: Secondary | ICD-10-CM | POA: Diagnosis not present

## 2021-02-23 NOTE — Patient Instructions (Signed)
Access Code: [email or web address] URL: [email or web address] Date: 02/23/2021 Prepared by: Ivery Quale  Exercises Standing Ankle Dorsiflexion Stretch on Chair - 2 x daily - 6 x weekly - 1 sets - 10 reps - 10 hold Standing Gastroc Stretch - 2 x daily - 6 x weekly - 1 sets - 3 reps - 30 hold Standing Soleus Stretch - 2 x daily - 6 x weekly - 1 sets - 3 reps - 30 hold Seated Toe Curl - 2 x daily - 6 x weekly - 3 sets - 10 reps Heel Raises with Counter Support - 2 x daily - 6 x weekly - 3 sets - 10 reps Walking Tandem Stance - 2 x daily - 6 x weekly - 3 reps Heel Toe Raises with Counter Support - 2 x daily - 6 x weekly - 2 sets - 10 reps Seated Ankle Eversion with Resistance - 2 x daily - 6 x weekly - 3 sets - 10 reps Seated Figure 4 Ankle Inversion with Resistance - 2 x daily - 6 x weekly - 3 sets - 10 reps

## 2021-02-23 NOTE — Therapy (Signed)
[facility] Physical Therapy [address] [place], [place], [address] Phone: [phone]   Fax:  [phone]  Physical Therapy Evaluation  Patient Details  Name: Candace Taylor MRN: [id number] Date of Birth: February 05, 1976 Referring Provider (PT): Persons, [place] PA   Encounter Date: 02/23/2021   PT End of Session - 02/23/21 1213     Visit Number 1    Number of Visits 12    Date for PT Re-Evaluation 04/06/21    PT Start Time 1100    PT Stop Time 1145    PT Time Calculation (min) 45 min    Activity Tolerance Patient limited by pain    Behavior During Therapy [facility] for tasks assessed/performed             Past Medical History:  Diagnosis Date   Allergy    Frequent headaches    Thyroid disease    Urine incontinence     History reviewed. No pertinent surgical history.  There were no vitals filed for this visit.    Subjective Assessment - 02/23/21 1109     Subjective Relays Lt ankle pain >6 months without known cause. She has been followed by Dr. Berline Chough and also saw Dr. Lajoyce Corners for this and then was referred to PT. 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 She relays she  tried 3 cortisone shot but did not help. She also relays MD mentioned DN may be necessary.    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"    Patient Stated Goals reduce pain    Currently in Pain? Yes    Pain Score 10-Worst pain ever   pain can be 0 at rest but can be 10 with activity   Pain Location Ankle    Pain Orientation Right    Pain Descriptors / Indicators Aching;Radiating    Pain Type Chronic pain    Pain Radiating Towards the front of her foot    Pain Onset More than a month ago    Pain Frequency Intermittent    Aggravating Factors  standing    Pain Relieving Factors rest                [facility] PT Assessment - 02/23/21 0001       Assessment   Medical Diagnosis Lt ankle pain and stiffness,  Lt achillies tendon contracture.    Referring Provider (PT) Persons, [place] PA    Onset Date/Surgical Date --   6 month onset of pain   Next MD Visit not scheduled    Prior Therapy no recent PT      Precautions   Precautions None    Precaution Comments she was prescribed ASO brace      Restrictions   Weight Bearing Restrictions No      Balance Screen   Has the patient fallen in the past 6 months No    Has the patient had a decrease in activity level because of a fear of falling?  No    Is the patient reluctant to leave their home because of a fear of falling?  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[employer], currently on leave      Cognition   Overall Cognitive Status Within Functional  Limits for tasks assessed      Observation/Other Assessments   Focus on Therapeutic Outcomes (FOTO)  45% functiona, goal 63%      ROM / Strength   AROM / PROM / Strength AROM;Strength      AROM   AROM Assessment Site Ankle    Right/Left Ankle Right;Left    Left Ankle Dorsiflexion --   to neutral only, has 10 degrees on her Rt   Left Ankle Plantar Flexion 40    Left Ankle Inversion 20    Left Ankle Eversion 10      Strength   Strength Assessment Site Ankle    Right/Left Ankle Right;Left    Right Ankle Dorsiflexion 5/5    Right Ankle Plantar Flexion 5/5    Right Ankle Inversion 5/5    Right Ankle Eversion 5/5    Left Ankle Dorsiflexion 4/5    Left Ankle Plantar Flexion 4/5    Left Ankle Inversion 4/5    Left Ankle Eversion 4/5      Flexibility   Soft Tissue Assessment /Muscle Length --   very tight heel cords on Lt     Palpation   Palpation comment TTP in achillies, gastroc, peroneals, ATFL, and plantar heel, trigger points noted in gastroc      Special Tests   Other special tests decreased mobilty A-P ankle glides and  calcaneal EV,INV glides      Ambulation/Gait   Ambulation/Gait Yes    Ambulation/Gait Assistance 7: Independent    Ambulation Distance (Feet) 150 Feet    Gait Comments limited community ambulator due to pain and antalgic gait on Lt                        Objective measurements completed on examination: See above findings.       [facility] Adult PT Treatment/Exercise - 02/23/21 0001       Modalities   Modalities Electrical Stimulation;Vasopneumatic      Programme researcher, broadcasting/film/video Location Lt ankle    Electrical Stimulation Action IFC    Electrical Stimulation Parameters to tolerance for 9 min, ended one minute early due to pain    Electrical Stimulation Goals Pain      Vasopneumatic   Number Minutes Vasopneumatic  10 minutes    Vasopnuematic Location  Ankle    Vasopneumatic Pressure High    Vasopneumatic Temperature  34                    PT Education - 02/23/21 1213     Education Details HEP,POC,TENS,DN    Person(s) Educated Patient    Methods Explanation;Demonstration;Verbal cues;Handout    Comprehension Verbalized understanding;Need further instruction              PT Short Term Goals - 02/23/21 1221       PT SHORT TERM GOAL #1   Title Pt will be I and compliant with HEP    Time 4    Period Weeks    Status New               PT Long Term Goals - 02/23/21 1221       PT LONG TERM GOAL #1   Title Pt will reduce pain by 50% with usual activity    Time 6    Period Weeks    Status New      PT LONG TERM GOAL #2   Title Pt will have [facility]  gait pattern for community ambulation    Time 6    Period Weeks    Status New      PT LONG TERM GOAL #3   Title Pt will improve Lt ankle ROM to [facility] and show DF AROM at least 5 deg past neutral.    Time 6    Period Weeks    Status New      PT LONG TERM GOAL #4   Title She will improve Lt ankle strength to 5/5 MMT all planes tested in supine and show at least 4+  plantarflexion strength in standing    Time 6    Period Weeks    Status New                    Plan - 02/23/21 1214     Clinical Impression Statement Pt presents with chronic Lt ankle pain and stiffness limiting gait and overall functional activity > 6 months. She has Lt achillies contracture and chronic ATFL tear confirmed on MRI. She will benefit from skilled PT to address her functional deficits and reduce pain.    Examination-Activity Limitations Lift;Stand;Stairs;Squat;Locomotion Level    Examination-Participation Restrictions Cleaning;Community Activity;Driving;Laundry;Yard Work;Shop;Occupation    Stability/Clinical Decision Making Evolving/Moderate complexity    Clinical Decision Making Moderate    Rehab Potential Good    PT Frequency 2x / week   1-2   PT Duration 6 weeks   4-6   PT Treatment/Interventions ADLs/Self Care Home Management;Cryotherapy;Electrical Stimulation;Iontophoresis 4mg /ml Dexamethasone;Moist Heat;Ultrasound;Gait training;Stair training;Therapeutic activities;Therapeutic exercise;Balance training;Neuromuscular re-education;Manual techniques;Passive range of motion;Dry needling;Joint Manipulations;Vasopneumatic Device;Taping    PT Next Visit Plan how was TENS?, consider DN ,needs Lt ankle mobility, heel cord stretching, and overall strength/balance work    PT Home Exercise Plan Access Code: [email or web address]    Consulted and Agree with Plan of Care Patient             Patient will benefit from skilled therapeutic intervention in order to improve the following deficits and impairments:  Decreased activity tolerance, Decreased balance, Decreased mobility, Decreased range of motion, Decreased strength, Increased edema, Hypermobility, Difficulty walking, Increased muscle spasms, Impaired flexibility, Pain  Visit Diagnosis: Pain in left ankle and joints of left foot  Stiffness of left ankle, not elsewhere classified  Difficulty in walking, not elsewhere  classified  Muscle weakness (generalized)  Localized edema     Problem List Patient Active Problem List   Diagnosis Date Noted   Wellness examination 07/31/2015   Plantar fasciitis 07/31/2015   Allergic rhinitis 07/31/2015   PHARYNGITIS, STREPTOCOCCAL 04/20/2010   Hypothyroidism 04/20/2010    04/22/2010 02/23/2021, 12:32 PM  [facility] Physical Therapy [address] [place], [place], [place] Phone: [phone]   Fax:  [phone]  Name: Candace Taylor MRN: Bing Matter Date of Birth: 1975/12/26

## 2021-02-26 ENCOUNTER — Ambulatory Visit: Payer: PRIVATE HEALTH INSURANCE | Admitting: Physical Therapy

## 2021-02-26 ENCOUNTER — Other Ambulatory Visit: Payer: Self-pay

## 2021-02-26 DIAGNOSIS — R262 Difficulty in walking, not elsewhere classified: Secondary | ICD-10-CM | POA: Diagnosis not present

## 2021-02-26 DIAGNOSIS — M25672 Stiffness of left ankle, not elsewhere classified: Secondary | ICD-10-CM

## 2021-02-26 DIAGNOSIS — M25572 Pain in left ankle and joints of left foot: Secondary | ICD-10-CM | POA: Diagnosis not present

## 2021-02-26 DIAGNOSIS — M6281 Muscle weakness (generalized): Secondary | ICD-10-CM

## 2021-02-26 DIAGNOSIS — R600 Localized edema: Secondary | ICD-10-CM

## 2021-02-26 NOTE — Therapy (Signed)
[facility] Physical Therapy [address] [place], [place], [address] Phone: [phone]   Fax:  [phone]  Physical Therapy Treatment  Patient Details  Name: Candace Taylor MRN: [id number] Date of Birth: May 18, 1976 Referring Provider (PT): Persons, [place] PA   Encounter Date: 02/26/2021   PT End of Session - 02/26/21 1147     Visit Number 2    Number of Visits 12    Date for PT Re-Evaluation 04/06/21    PT Start Time 1019    PT Stop Time 1100    PT Time Calculation (min) 41 min    Activity Tolerance Patient limited by pain    Behavior During Therapy [facility] for tasks assessed/performed             Past Medical History:  Diagnosis Date   Allergy    Frequent headaches    Thyroid disease    Urine incontinence     No past surgical history on file.  There were no vitals filed for this visit.   Subjective Assessment - 02/26/21 1141     Subjective relays she tried the HEP but the standing stretches were hurting so she stopped. She is intrested in DN after discussing this intervention with her.    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"    Patient Stated Goals reduce pain    Pain Onset More than a month ago                               [facility] Adult PT Treatment/Exercise - 02/26/21 0001       Exercises   Exercises Ankle      Manual Therapy   Manual therapy comments STM and active compression with and without DN      Ankle Exercises: Stretches   Plantar Fascia Stretch 3 reps;30 seconds    Plantar Fascia Stretch Limitations left in sitting with leg crossed    Gastroc Stretch 3 reps;30 seconds    Gastroc Stretch Limitations left with strap in sitting      Ankle Exercises: Seated   ABC's 2 reps    Other Seated Ankle Exercises seated toe curls X20              Trigger Point Dry Needling - 02/26/21 0001     Consent Given? Yes    Education Handout Provided Yes    Muscles Treated Lower  Quadrant Soleus;Gastrocnemius;Anterior tibialis;Peroneals    Dry Needling Comments good overall tolerance, twitch responses noted and improvements with toe curling afterwords.                    PT Short Term Goals - 02/23/21 1221       PT SHORT TERM GOAL #1   Title Pt will be I and compliant with HEP    Time 4    Period Weeks    Status New               PT Long Term Goals - 02/23/21 1221       PT LONG TERM GOAL #1   Title Pt will reduce pain by 50% with usual activity    Time 6    Period Weeks    Status New      PT LONG TERM GOAL #2   Title Pt will have WFL gait pattern for community ambulation    Time 6    Period Weeks  Status New      PT LONG TERM GOAL #3   Title Pt will improve Lt ankle ROM to [facility] and show DF AROM at least 5 deg past neutral.    Time 6    Period Weeks    Status New      PT LONG TERM GOAL #4   Title She will improve Lt ankle strength to 5/5 MMT all planes tested in supine and show at least 4+ plantarflexion strength in standing    Time 6    Period Weeks    Status New                   Plan - 02/26/21 1148     Clinical Impression Statement We modified her stretching program from standing to seated to improve her pain levels with this. We trialed DN intervention today in efforts to reduce pain and triggerpoints noted in her left lower leg. She had good overall tolerance to this and did have significant improvement in her ability to flex her toes afterwords.    Examination-Activity Limitations Lift;Stand;Stairs;Squat;Locomotion Level    Examination-Participation Restrictions Cleaning;Community Activity;Driving;Laundry;Yard Work;Shop;Occupation    Stability/Clinical Decision Making Evolving/Moderate complexity    Rehab Potential Good    PT Frequency 2x / week   1-2   PT Duration 6 weeks   4-6   PT Treatment/Interventions ADLs/Self Care Home Management;Cryotherapy;Electrical Stimulation;Iontophoresis 4mg /ml  Dexamethasone;Moist Heat;Ultrasound;Gait training;Stair training;Therapeutic activities;Therapeutic exercise;Balance training;Neuromuscular re-education;Manual techniques;Passive range of motion;Dry needling;Joint Manipulations;Vasopneumatic Device;Taping    PT Next Visit Plan how was DN and consider again if helped ,needs Lt ankle mobility, heel cord stretching, and overall strength/balance work    PT Home Exercise Plan Access Code: [email or web address]    Consulted and Agree with Plan of Care Patient             Patient will benefit from skilled therapeutic intervention in order to improve the following deficits and impairments:  Decreased activity tolerance, Decreased balance, Decreased mobility, Decreased range of motion, Decreased strength, Increased edema, Hypermobility, Difficulty walking, Increased muscle spasms, Impaired flexibility, Pain  Visit Diagnosis: Pain in left ankle and joints of left foot  Stiffness of left ankle, not elsewhere classified  Difficulty in walking, not elsewhere classified  Muscle weakness (generalized)  Localized edema     Problem List Patient Active Problem List   Diagnosis Date Noted   Wellness examination 07/31/2015   Plantar fasciitis 07/31/2015   Allergic rhinitis 07/31/2015   PHARYNGITIS, STREPTOCOCCAL 04/20/2010   Hypothyroidism 04/20/2010    04/22/2010 02/26/2021, 11:50 AM  [facility] Physical Therapy [address] [place], [place], [place] Phone: [phone]   Fax:  [phone]  Name: Candace Taylor MRN: Bing Matter Date of Birth: September 13, 1975

## 2021-03-11 ENCOUNTER — Encounter: Payer: PRIVATE HEALTH INSURANCE | Admitting: Physical Therapy

## 2021-03-18 ENCOUNTER — Other Ambulatory Visit: Payer: Self-pay

## 2021-03-18 ENCOUNTER — Encounter: Payer: Self-pay | Admitting: Physical Therapy

## 2021-03-18 ENCOUNTER — Ambulatory Visit: Payer: PRIVATE HEALTH INSURANCE | Admitting: Physical Therapy

## 2021-03-18 DIAGNOSIS — R262 Difficulty in walking, not elsewhere classified: Secondary | ICD-10-CM | POA: Diagnosis not present

## 2021-03-18 DIAGNOSIS — M6281 Muscle weakness (generalized): Secondary | ICD-10-CM

## 2021-03-18 DIAGNOSIS — M25572 Pain in left ankle and joints of left foot: Secondary | ICD-10-CM

## 2021-03-18 DIAGNOSIS — M25672 Stiffness of left ankle, not elsewhere classified: Secondary | ICD-10-CM | POA: Diagnosis not present

## 2021-03-18 DIAGNOSIS — R600 Localized edema: Secondary | ICD-10-CM

## 2021-03-18 NOTE — Therapy (Signed)
[facility] Physical Therapy [address] [place], [place], [address] Phone: [phone]   Fax:  [phone]  Physical Therapy Treatment  Patient Details  Name: Candace Taylor MRN: [id number] Date of Birth: 24-Jan-1976 Referring Provider (PT): Persons, [place] PA   Encounter Date: 03/18/2021   PT End of Session - 03/18/21 1234     Visit Number 3    Number of Visits 12    Date for PT Re-Evaluation 04/06/21    PT Start Time 1153    PT Stop Time 1231    PT Time Calculation (min) 38 min    Activity Tolerance Patient tolerated treatment well    Behavior During Therapy [facility] for tasks assessed/performed             Past Medical History:  Diagnosis Date   Allergy    Frequent headaches    Thyroid disease    Urine incontinence     History reviewed. No pertinent surgical history.  There were no vitals filed for this visit.   Subjective Assessment - 03/18/21 1219     Subjective relays she was sore after DN but then this got better and she was able to curl her toes better. She agreees to another treatment of DN.    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"    Patient Stated Goals reduce pain    Pain Onset More than a month ago             [facility] Adult PT Treatment/Exercise - 03/18/21 0001       Manual Therapy   Manual therapy comments STM and active compression with and without DN      Ankle Exercises: Stretches   Gastroc Stretch 3 reps;30 seconds    Gastroc Stretch Limitations left ankle with strap in sitting      Ankle Exercises: Seated   BAPS Sitting;Level 3;15 reps   A-P, lateral, circles   Other Seated Ankle Exercises seated rocker board 2 min A-P, 2 min lateral holding 5 seconds ea way      Ankle Exercises: Supine   T-Band red 4 way X15 ea              Trigger Point Dry Needling - 03/18/21 0001     Consent Given? Yes    Education Handout Provided Previously provided    Muscles Treated Lower Quadrant  Soleus;Gastrocnemius;Anterior tibialis;Peroneals    Dry Needling Comments good overall tolerance, twitch responses noted                     PT Short Term Goals - 02/23/21 1221       PT SHORT TERM GOAL #1   Title Pt will be I and compliant with HEP    Time 4    Period Weeks    Status New               PT Long Term Goals - 02/23/21 1221       PT LONG TERM GOAL #1   Title Pt will reduce pain by 50% with usual activity    Time 6    Period Weeks    Status New      PT LONG TERM GOAL #2   Title Pt will have WFL gait pattern for community ambulation    Time 6    Period Weeks    Status New      PT LONG TERM GOAL #3   Title Pt will improve Lt  ankle ROM to [facility] and show DF AROM at least 5 deg past neutral.    Time 6    Period Weeks    Status New      PT LONG TERM GOAL #4   Title She will improve Lt ankle strength to 5/5 MMT all planes tested in supine and show at least 4+ plantarflexion strength in standing    Time 6    Period Weeks    Status New                   Plan - 03/18/21 1235     Clinical Impression Statement Continued with DN today with good overall tolerance noted and no immediate adverse reactions. Let her know that sorness is normal with this. We then worked to improve Lt ankle ROM and strength with improvements noted in activity tolerance. Continue POC    Examination-Activity Limitations Lift;Stand;Stairs;Squat;Locomotion Level    Examination-Participation Restrictions Cleaning;Community Activity;Driving;Laundry;Yard Work;Shop;Occupation    Stability/Clinical Decision Making Evolving/Moderate complexity    Rehab Potential Good    PT Frequency 2x / week   1-2   PT Duration 6 weeks   4-6   PT Treatment/Interventions ADLs/Self Care Home Management;Cryotherapy;Electrical Stimulation;Iontophoresis 4mg /ml Dexamethasone;Moist Heat;Ultrasound;Gait training;Stair training;Therapeutic activities;Therapeutic exercise;Balance  training;Neuromuscular re-education;Manual techniques;Passive range of motion;Dry needling;Joint Manipulations;Vasopneumatic Device;Taping    PT Next Visit Plan how was DN and consider again if helped ,needs Lt ankle mobility, heel cord stretching, and overall strength/balance work    PT Home Exercise Plan Access Code: [email or web address]    Consulted and Agree with Plan of Care Patient             Patient will benefit from skilled therapeutic intervention in order to improve the following deficits and impairments:  Decreased activity tolerance, Decreased balance, Decreased mobility, Decreased range of motion, Decreased strength, Increased edema, Hypermobility, Difficulty walking, Increased muscle spasms, Impaired flexibility, Pain  Visit Diagnosis: Pain in left ankle and joints of left foot  Stiffness of left ankle, not elsewhere classified  Difficulty in walking, not elsewhere classified  Muscle weakness (generalized)  Localized edema     Problem List Patient Active Problem List   Diagnosis Date Noted   Wellness examination 07/31/2015   Plantar fasciitis 07/31/2015   Allergic rhinitis 07/31/2015   PHARYNGITIS, STREPTOCOCCAL 04/20/2010   Hypothyroidism 04/20/2010    04/22/2010, PT,DPT 03/18/2021, 12:36 PM  [facility] Physical Therapy [address] [place], [place], [place] Phone: [phone]   Fax:  [phone]  Name: Candace Taylor MRN: Bing Matter Date of Birth: Jul 02, 1975

## 2021-03-20 ENCOUNTER — Other Ambulatory Visit: Payer: Self-pay

## 2021-03-20 ENCOUNTER — Ambulatory Visit (INDEPENDENT_AMBULATORY_CARE_PROVIDER_SITE_OTHER): Payer: PRIVATE HEALTH INSURANCE | Admitting: Physical Therapy

## 2021-03-20 DIAGNOSIS — M6281 Muscle weakness (generalized): Secondary | ICD-10-CM

## 2021-03-20 DIAGNOSIS — M25572 Pain in left ankle and joints of left foot: Secondary | ICD-10-CM

## 2021-03-20 DIAGNOSIS — M25672 Stiffness of left ankle, not elsewhere classified: Secondary | ICD-10-CM

## 2021-03-20 DIAGNOSIS — R262 Difficulty in walking, not elsewhere classified: Secondary | ICD-10-CM | POA: Diagnosis not present

## 2021-03-20 DIAGNOSIS — R600 Localized edema: Secondary | ICD-10-CM

## 2021-03-20 NOTE — Therapy (Signed)
[facility] Physical Therapy [address] [place], [place], [address] Phone: [phone]   Fax:  [phone]  Physical Therapy Treatment  Patient Details  Name: Candace Taylor MRN: [id number] Date of Birth: Aug 16, 1975 Referring Provider (PT): Persons, [place] PA   Encounter Date: 03/20/2021   PT End of Session - 03/20/21 1136     Visit Number 4    Number of Visits 12    Date for PT Re-Evaluation 04/06/21    PT Start Time 1102    PT Stop Time 1133    PT Time Calculation (min) 31 min    Activity Tolerance Patient tolerated treatment well    Behavior During Therapy [facility] for tasks assessed/performed             Past Medical History:  Diagnosis Date   Allergy    Frequent headaches    Thyroid disease    Urine incontinence     No past surgical history on file.  There were no vitals filed for this visit.   Subjective Assessment - 03/20/21 1111     Subjective She says her foot is really hurting today on the front and lateral part. 6/10 pain overall. She does not wish to have DN today.    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"    Patient Stated Goals reduce pain    Pain Onset More than a month ago    Aggravating Factors  walking               [facility] Adult PT Treatment/Exercise - 03/20/21 0001       Modalities   Modalities Iontophoresis      Iontophoresis   Type of Iontophoresis Dexamethasone    Location Lt ankle over ATFL    Dose 1.0 CC    Time 4-6 hour wear home patch      Ankle Exercises: Stretches   Gastroc Stretch 3 reps;30 seconds    Gastroc Stretch Limitations left ankle with strap in supine, 3 for gastroc, 3 for soleus      Ankle Exercises: Supine   T-Band red 4 way X15 ea    Other Supine Ankle Exercises ankle alphabet X2 with 2#, cirlces X10 CW,CCW with 2#                       PT Short Term Goals - 02/23/21 1221       PT SHORT TERM GOAL #1   Title Pt will be I and compliant with  HEP    Time 4    Period Weeks    Status New               PT Long Term Goals - 02/23/21 1221       PT LONG TERM GOAL #1   Title Pt will reduce pain by 50% with usual activity    Time 6    Period Weeks    Status New      PT LONG TERM GOAL #2   Title Pt will have WFL gait pattern for community ambulation    Time 6    Period Weeks    Status New      PT LONG TERM GOAL #3   Title Pt will improve Lt ankle ROM to [facility] and show DF AROM at least 5 deg past neutral.    Time 6    Period Weeks    Status New      PT LONG TERM GOAL #  4   Title She will improve Lt ankle strength to 5/5 MMT all planes tested in supine and show at least 4+ plantarflexion strength in standing    Time 6    Period Weeks    Status New                   Plan - 03/20/21 1138     Clinical Impression Statement She declined DN today, she was in more overall pain and had more swelling today so we trialed Ionto today in efforts to reduce pain and inflammation.    Examination-Activity Limitations Lift;Stand;Stairs;Squat;Locomotion Level    Examination-Participation Restrictions Cleaning;Community Activity;Driving;Laundry;Yard Work;Shop;Occupation    Stability/Clinical Decision Making Evolving/Moderate complexity    Rehab Potential Good    PT Frequency 2x / week   1-2   PT Duration 6 weeks   4-6   PT Treatment/Interventions ADLs/Self Care Home Management;Cryotherapy;Electrical Stimulation;Iontophoresis 4mg /ml Dexamethasone;Moist Heat;Ultrasound;Gait training;Stair training;Therapeutic activities;Therapeutic exercise;Balance training;Neuromuscular re-education;Manual techniques;Passive range of motion;Dry needling;Joint Manipulations;Vasopneumatic Device;Taping    PT Next Visit Plan how was ionto. and consider again if helped ,needs Lt ankle mobility, heel cord stretching, and overall strength/balance work    PT Home Exercise Plan Access Code: [email or web address]    Consulted and Agree with Plan of Care Patient              Patient will benefit from skilled therapeutic intervention in order to improve the following deficits and impairments:  Decreased activity tolerance, Decreased balance, Decreased mobility, Decreased range of motion, Decreased strength, Increased edema, Hypermobility, Difficulty walking, Increased muscle spasms, Impaired flexibility, Pain  Visit Diagnosis: Pain in left ankle and joints of left foot  Stiffness of left ankle, not elsewhere classified  Difficulty in walking, not elsewhere classified  Muscle weakness (generalized)  Localized edema     Problem List Patient Active Problem List   Diagnosis Date Noted   Wellness examination 07/31/2015   Plantar fasciitis 07/31/2015   Allergic rhinitis 07/31/2015   PHARYNGITIS, STREPTOCOCCAL 04/20/2010   Hypothyroidism 04/20/2010    04/22/2010, PT,DPT 03/20/2021, 11:42 AM  [facility] Physical Therapy [address] [place], [place], [place] Phone: [phone]   Fax:  [phone]  Name: Candace Taylor MRN: Bing Matter Date of Birth: [birth year]

## 2021-03-24 ENCOUNTER — Encounter: Payer: PRIVATE HEALTH INSURANCE | Admitting: Physical Therapy

## 2021-03-26 ENCOUNTER — Encounter: Payer: PRIVATE HEALTH INSURANCE | Admitting: Physical Therapy

## 2021-03-31 ENCOUNTER — Encounter: Payer: PRIVATE HEALTH INSURANCE | Admitting: Physical Therapy

## 2021-03-31 ENCOUNTER — Telehealth: Payer: Self-pay | Admitting: Physical Therapy

## 2021-03-31 NOTE — Telephone Encounter (Signed)
She did not show for PT appointment today. They were contacted and informed of this via voicemail. They were provided the date and time of their next appointment on voicemail. They were instructed to call us to let us know if they cannot make their appointment or if we need to cancel her appointments.  Ivery Quale, PT, DPT 03/31/21 12:15 PM

## 2021-04-02 ENCOUNTER — Telehealth: Payer: Self-pay | Admitting: Physical Therapy

## 2021-04-02 ENCOUNTER — Encounter: Payer: PRIVATE HEALTH INSURANCE | Admitting: Physical Therapy

## 2021-04-02 NOTE — Telephone Encounter (Signed)
I called patient about her missed appointment today and she was not aware of this as she had requested only Friday appointments and our scheduling staff was supposed to have this set up for her. She has only Friday appointments moving forward and I feel we likely had an error on or end and did not cancel the non-Friday appointments she had. I am not able to change this from a no-show in our Epic EMR system but do want to document that her missed visits are cancellations and not no-shows due to our scheduling error to avoid this counting against her with our attendance policy  Ivery Quale, PT, DPT 04/02/21 12:28 PM

## 2021-04-03 ENCOUNTER — Telehealth: Payer: Self-pay | Admitting: Rehabilitative and Restorative Service Providers"

## 2021-04-03 ENCOUNTER — Encounter: Payer: PRIVATE HEALTH INSURANCE | Admitting: Rehabilitative and Restorative Service Providers"

## 2021-04-03 NOTE — Telephone Encounter (Signed)
Called and left voice message regarding missed appointment today.  Per review of chart, previous 2 missed visits were not Friday appointments as Pt. Had requested per last phone call note.  Reminded of next appointment time.  Chyrel Masson, PT, DPT, OCS, ATC 04/03/21  12:00 PM

## 2021-04-10 ENCOUNTER — Ambulatory Visit: Payer: PRIVATE HEALTH INSURANCE | Admitting: Physical Therapy

## 2021-04-10 ENCOUNTER — Encounter: Payer: PRIVATE HEALTH INSURANCE | Admitting: Physical Therapy

## 2021-04-10 ENCOUNTER — Other Ambulatory Visit: Payer: Self-pay

## 2021-04-10 DIAGNOSIS — M25672 Stiffness of left ankle, not elsewhere classified: Secondary | ICD-10-CM | POA: Diagnosis not present

## 2021-04-10 DIAGNOSIS — M6281 Muscle weakness (generalized): Secondary | ICD-10-CM | POA: Diagnosis not present

## 2021-04-10 DIAGNOSIS — R262 Difficulty in walking, not elsewhere classified: Secondary | ICD-10-CM

## 2021-04-10 DIAGNOSIS — M25572 Pain in left ankle and joints of left foot: Secondary | ICD-10-CM

## 2021-04-10 NOTE — Therapy (Addendum)
[facility] [address] [place], [place], [address] Phone: [phone]   Fax:  [phone]  Physical Therapy Treatment/Discharge addendum PHYSICAL THERAPY DISCHARGE SUMMARY  Visits from Start of Care: 5  Current functional level related to goals / functional outcomes: See below   Remaining deficits: See below   Education / Equipment: HEP  Plan:  Patient goals were not met. Patient is being discharged due to not returning since last visit.  Elsie Ra, PT, DPT 06/19/21 9:17 AM     Patient Details  Name: Candace Taylor MRN: [id number] Date of Birth: [birth year] Referring Provider (PT): Persons, Bevely Palmer PA   Encounter Date: 04/10/2021   PT End of Session - 04/10/21 1101     Visit Number 5    Number of Visits 12    Date for PT Re-Evaluation 04/06/21    PT Start Time 1019    PT Stop Time 1100    PT Time Calculation (min) 41 min    Activity Tolerance Patient tolerated treatment well    Behavior During Therapy [facility] for tasks assessed/performed             Past Medical History:  Diagnosis Date   Allergy    Frequent headaches    Thyroid disease    Urine incontinence     No past surgical history on file.  There were no vitals filed for this visit.   Subjective Assessment - 04/10/21 1100     Subjective relays her foot/ankle has not improved, she will see orthopedic surgeon for second opinion today.    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"    Patient Stated Goals reduce pain    Pain Onset More than a month ago               [facility] Adult PT Treatment/Exercise - 04/10/21 0001       Ultrasound   Ultrasound Location 1.0 mhz, 50%, 1.0 w/cm2 X10 min with biofreeze to Rt lateral ankle over ATFL    Ultrasound Goals Pain      Ankle Exercises: Stretches   Gastroc Stretch 3 reps;30 seconds    Gastroc Stretch Limitations left ankle with strap in supine, 3 for gastroc, 3 for soleus       Ankle Exercises: Supine   T-Band red 4 way X15 ea    Other Supine Ankle Exercises ankle alphabet X2 with 2#, cirlces X20 CW,CCW with 2#      Ankle Exercises: Standing   Other Standing Ankle Exercises tandem balance 30 sec X2                       PT Short Term Goals - 02/23/21 1221       PT SHORT TERM GOAL #1   Title Pt will be I and compliant with HEP    Time 4    Period Weeks    Status New               PT Long Term Goals - 02/23/21 1221       PT LONG TERM GOAL #1   Title Pt will reduce pain by 50% with usual activity    Time 6    Period Weeks    Status New      PT LONG TERM GOAL #2   Title Pt will have WFL gait pattern for community ambulation    Time 6    Period Weeks    Status New  PT LONG TERM GOAL #3   Title Pt will improve Lt ankle ROM to [facility] and show DF AROM at least 5 deg past neutral.    Time 6    Period Weeks    Status New      PT LONG TERM GOAL #4   Title She will improve Lt ankle strength to 5/5 MMT all planes tested in supine and show at least 4+ plantarflexion strength in standing    Time 6    Period Weeks    Status New                   Plan - 04/10/21 1118     Clinical Impression Statement Her pain is not improving with PT and she still has limited tolerance for standing and walking. We trialed U.S. today to see if this helps symptoms any. She will meet with orhtopedic surgeon today for second opinion. She does have one more PT visit scheduled but I recommeded she cancel this if she does not see improvments from U.S. as we have exhausted all other conservative PT interventions.    Examination-Activity Limitations Lift;Stand;Stairs;Squat;Locomotion Level    Examination-Participation Restrictions Cleaning;Community Activity;Driving;Laundry;Yard Work;Shop;Occupation    Stability/Clinical Decision Making Evolving/Moderate complexity    Rehab Potential Good    PT Frequency 2x / week   1-2   PT Duration 6 weeks   4-6    PT Treatment/Interventions ADLs/Self Care Home Management;Cryotherapy;Electrical Stimulation;Iontophoresis 72m/ml Dexamethasone;Moist Heat;Ultrasound;Gait training;Stair training;Therapeutic activities;Therapeutic exercise;Balance training;Neuromuscular re-education;Manual techniques;Passive range of motion;Dry needling;Joint Manipulations;Vasopneumatic Device;Taping    PT Next Visit Plan how was U.S, what did new MD say?    PT Home Exercise Plan Access Code: [email or web address]    [email or web address] Agree with Plan of Care Patient             Patient will benefit from skilled therapeutic intervention in order to improve the following deficits and impairments:  Decreased activity tolerance, Decreased balance, Decreased mobility, Decreased range of motion, Decreased strength, Increased edema, Hypermobility, Difficulty walking, Increased muscle spasms, Impaired flexibility, Pain  Visit Diagnosis: Pain in left ankle and joints of left foot  Stiffness of left ankle, not elsewhere classified  Difficulty in walking, not elsewhere classified  Muscle weakness (generalized)     Problem List Patient Active Problem List   Diagnosis Date Noted   Wellness examination 07/31/2015   Plantar fasciitis 07/31/2015   Allergic rhinitis 07/31/2015   PHARYNGITIS, STREPTOCOCCAL 04/20/2010   Hypothyroidism 04/20/2010    BDebbe Odea PT,DPT 04/10/2021, 11:24 AM  [facility] [address] [address]Phone: [phone]  Fax:  [phone] Name: Candace DeckmanMRN: [id number] of Birth: 404-18-1977

## 2021-04-17 ENCOUNTER — Encounter: Payer: PRIVATE HEALTH INSURANCE | Admitting: Physical Therapy

## 2021-07-10 ENCOUNTER — Other Ambulatory Visit (HOSPITAL_COMMUNITY): Payer: Self-pay | Admitting: Orthopaedic Surgery

## 2021-07-10 ENCOUNTER — Other Ambulatory Visit: Payer: Self-pay

## 2021-07-10 ENCOUNTER — Ambulatory Visit (HOSPITAL_COMMUNITY)
Admission: RE | Admit: 2021-07-10 | Discharge: 2021-07-10 | Disposition: A | Discharge status: Home / Self Care | Payer: PRIVATE HEALTH INSURANCE | Source: Ambulatory Visit | Attending: Orthopaedic Surgery | Admitting: Orthopaedic Surgery

## 2021-07-10 DIAGNOSIS — M79662 Pain in left lower leg: Secondary | ICD-10-CM

## 2021-07-10 DIAGNOSIS — M7989 Other specified soft tissue disorders: Secondary | ICD-10-CM | POA: Diagnosis not present

## 2022-11-26 ENCOUNTER — Other Ambulatory Visit: Payer: Self-pay | Admitting: Sports Medicine

## 2022-11-26 DIAGNOSIS — M25512 Pain in left shoulder: Secondary | ICD-10-CM

## 2022-11-29 ENCOUNTER — Ambulatory Visit
Admission: RE | Admit: 2022-11-29 | Discharge: 2022-11-29 | Disposition: A | Discharge status: Home / Self Care | Payer: Managed Care, Other (non HMO) | Source: Ambulatory Visit | Attending: Sports Medicine | Admitting: Sports Medicine

## 2022-11-29 DIAGNOSIS — M25512 Pain in left shoulder: Secondary | ICD-10-CM

## 2022-12-22 ENCOUNTER — Other Ambulatory Visit: Payer: Self-pay | Admitting: Sports Medicine

## 2022-12-22 DIAGNOSIS — M25512 Pain in left shoulder: Secondary | ICD-10-CM

## 2022-12-29 ENCOUNTER — Ambulatory Visit
Admission: RE | Admit: 2022-12-29 | Discharge: 2022-12-29 | Disposition: A | Discharge status: Home / Self Care | Payer: Managed Care, Other (non HMO) | Source: Ambulatory Visit | Attending: Sports Medicine | Admitting: Sports Medicine

## 2022-12-29 DIAGNOSIS — M25512 Pain in left shoulder: Secondary | ICD-10-CM

## 2023-03-04 IMAGING — CR DG ANKLE COMPLETE 3+V*R*
3 series · 3 of 3 positions shown · non-contrast
Comparison: None.

CLINICAL DATA: Ankle pain

EXAM:
RIGHT ANKLE - COMPLETE 3+ VIEW

[x ankle ap right]
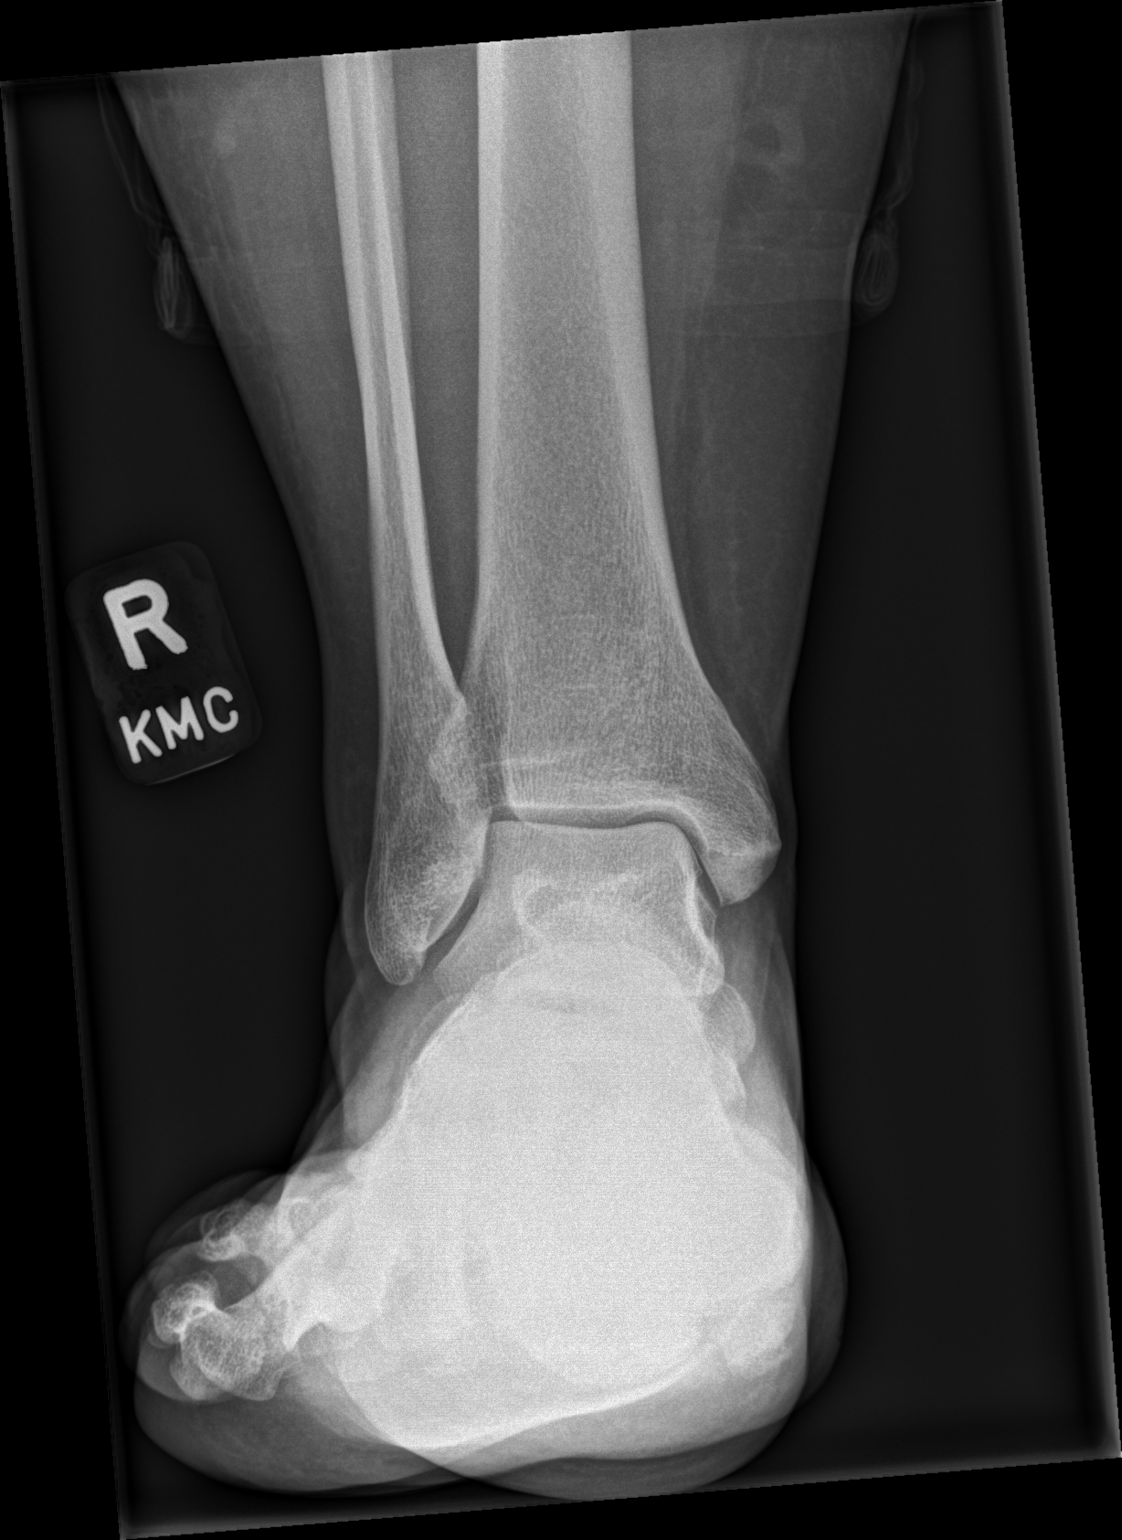

[x ankle obl right]
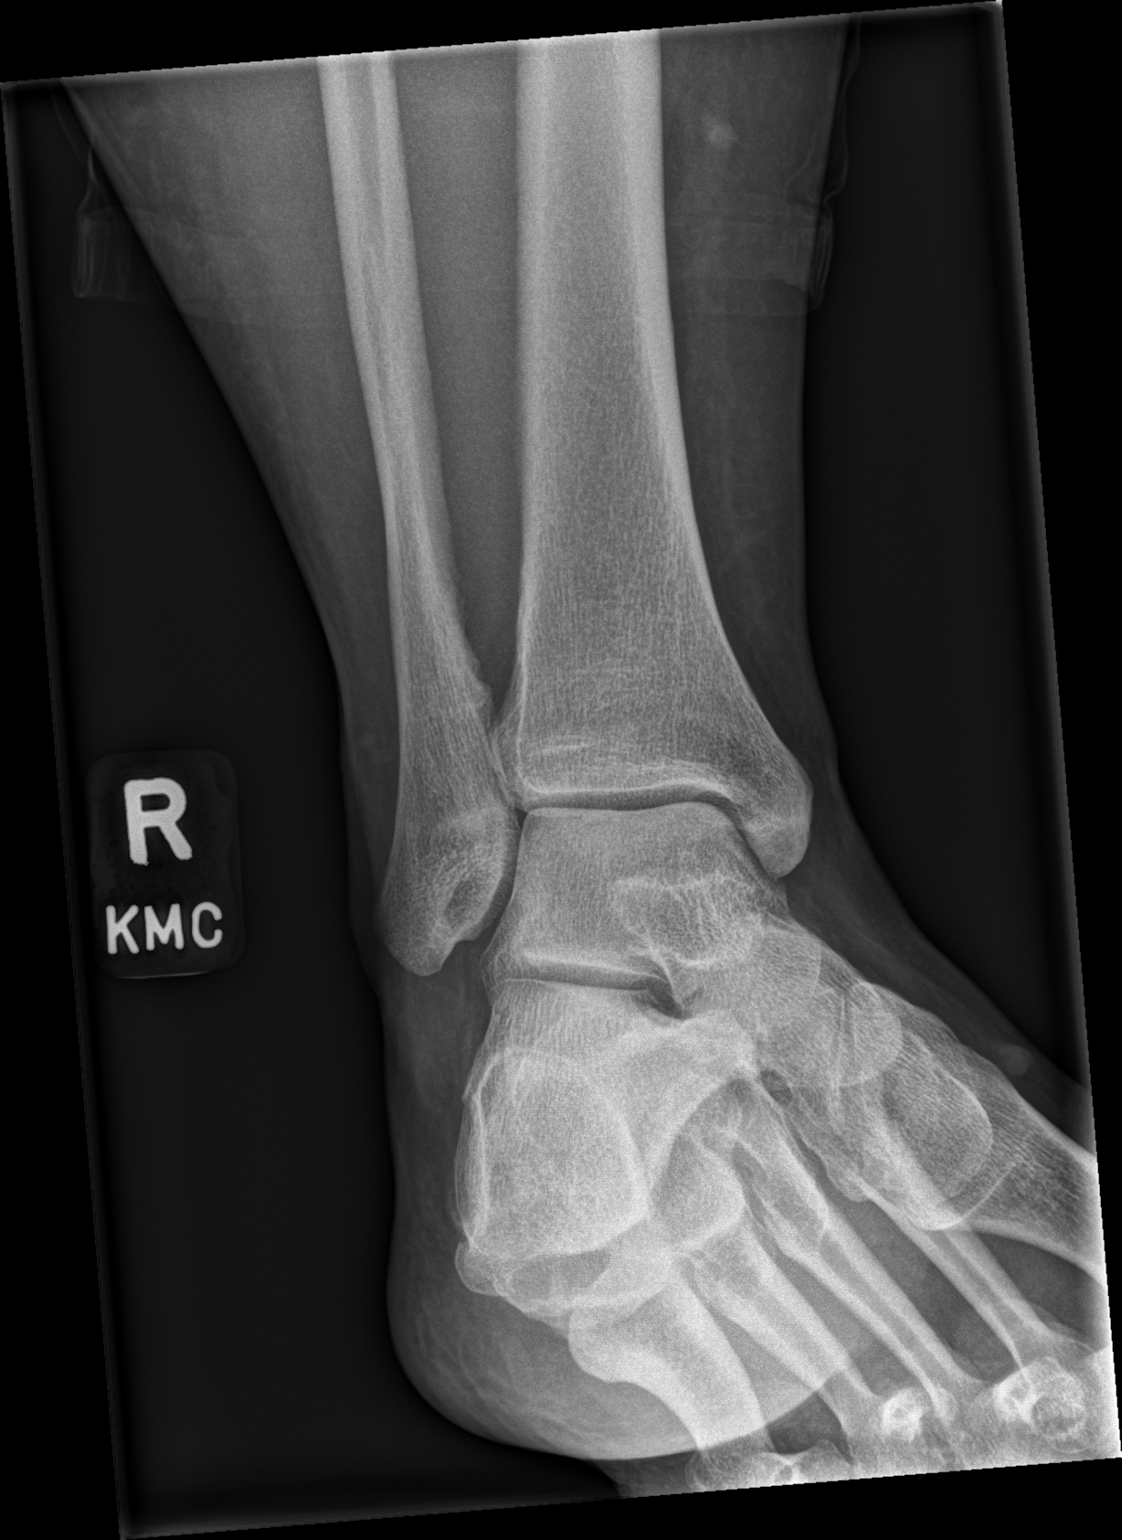

[x ankle lat right]
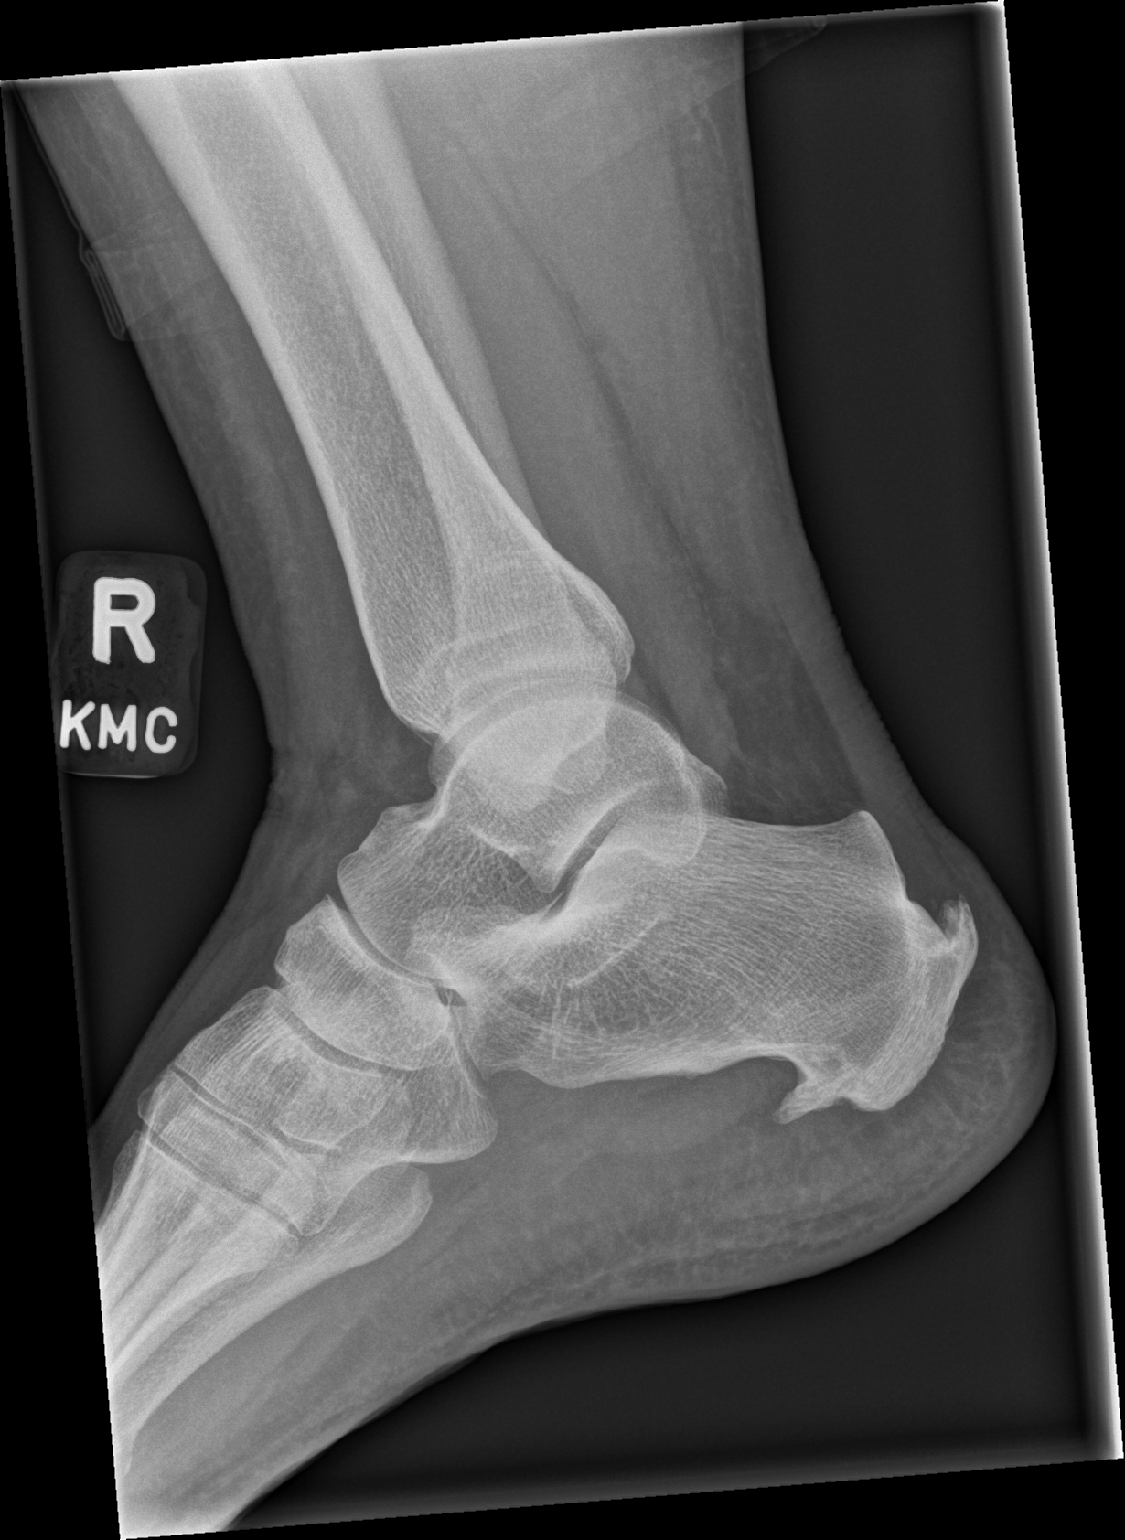

[3 of 3 positions shown; findings below may reference images not displayed]

FINDINGS: There is no evidence of fracture, dislocation, or joint effusion.
There is no evidence of arthropathy or other focal bone abnormality.
Soft tissues are unremarkable. Large plantar calcaneal spur
IMPRESSION: Negative.  Large plantar calcaneal spur

## 2023-10-04 ENCOUNTER — Other Ambulatory Visit: Payer: Self-pay | Admitting: Orthopedic Surgery

## 2023-10-04 DIAGNOSIS — M25512 Pain in left shoulder: Secondary | ICD-10-CM

## 2023-10-18 ENCOUNTER — Ambulatory Visit
Admission: RE | Admit: 2023-10-18 | Discharge: 2023-10-18 | Discharge status: Home / Self Care | Source: Ambulatory Visit | Attending: Orthopedic Surgery | Admitting: Orthopedic Surgery

## 2023-10-18 DIAGNOSIS — M25512 Pain in left shoulder: Secondary | ICD-10-CM
